# Patient Record
Sex: Female | Born: 1995 | Race: White | Hispanic: No | Marital: Single | Smoking: Never smoker
Health system: Southern US, Community
[De-identification: ages and names within clinical notes are randomized; demographics above are authoritative.]

## PROBLEM LIST (undated history)

## (undated) DIAGNOSIS — N83209 Unspecified ovarian cyst, unspecified side: Secondary | ICD-10-CM

## (undated) DIAGNOSIS — L509 Urticaria, unspecified: Secondary | ICD-10-CM

## (undated) DIAGNOSIS — J45909 Unspecified asthma, uncomplicated: Secondary | ICD-10-CM

## (undated) DIAGNOSIS — L309 Dermatitis, unspecified: Secondary | ICD-10-CM

## (undated) HISTORY — DX: Dermatitis, unspecified: L30.9

## (undated) HISTORY — DX: Urticaria, unspecified: L50.9

## (undated) HISTORY — DX: Unspecified ovarian cyst, unspecified side: N83.209

---
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
## 2015-04-08 ENCOUNTER — Emergency Department (HOSPITAL_BASED_OUTPATIENT_CLINIC_OR_DEPARTMENT_OTHER): Payer: Federal, State, Local not specified - PPO

## 2015-04-08 ENCOUNTER — Encounter (HOSPITAL_BASED_OUTPATIENT_CLINIC_OR_DEPARTMENT_OTHER): Payer: Self-pay | Admitting: *Deleted

## 2015-04-08 ENCOUNTER — Emergency Department (HOSPITAL_BASED_OUTPATIENT_CLINIC_OR_DEPARTMENT_OTHER)
Admission: EM | Admit: 2015-04-08 | Discharge: 2015-04-08 | Disposition: A | Discharge status: Home / Self Care | Payer: Federal, State, Local not specified - PPO | Attending: Emergency Medicine | Admitting: Emergency Medicine

## 2015-04-08 DIAGNOSIS — Y998 Other external cause status: Secondary | ICD-10-CM | POA: Insufficient documentation

## 2015-04-08 DIAGNOSIS — S8002XA Contusion of left knee, initial encounter: Secondary | ICD-10-CM | POA: Insufficient documentation

## 2015-04-08 DIAGNOSIS — S79911A Unspecified injury of right hip, initial encounter: Secondary | ICD-10-CM | POA: Diagnosis not present

## 2015-04-08 DIAGNOSIS — J45909 Unspecified asthma, uncomplicated: Secondary | ICD-10-CM | POA: Diagnosis not present

## 2015-04-08 DIAGNOSIS — S8992XA Unspecified injury of left lower leg, initial encounter: Secondary | ICD-10-CM | POA: Diagnosis present

## 2015-04-08 DIAGNOSIS — Z3202 Encounter for pregnancy test, result negative: Secondary | ICD-10-CM | POA: Diagnosis not present

## 2015-04-08 DIAGNOSIS — Z79899 Other long term (current) drug therapy: Secondary | ICD-10-CM | POA: Insufficient documentation

## 2015-04-08 DIAGNOSIS — Y9389 Activity, other specified: Secondary | ICD-10-CM | POA: Insufficient documentation

## 2015-04-08 DIAGNOSIS — S79912A Unspecified injury of left hip, initial encounter: Secondary | ICD-10-CM | POA: Insufficient documentation

## 2015-04-08 DIAGNOSIS — Y92410 Unspecified street and highway as the place of occurrence of the external cause: Secondary | ICD-10-CM | POA: Diagnosis not present

## 2015-04-08 DIAGNOSIS — M25552 Pain in left hip: Secondary | ICD-10-CM

## 2015-04-08 DIAGNOSIS — M25551 Pain in right hip: Secondary | ICD-10-CM

## 2015-04-08 HISTORY — DX: Unspecified asthma, uncomplicated: J45.909

## 2015-04-08 LAB — PREGNANCY, URINE: PREG TEST UR: NEGATIVE

## 2015-04-08 MED ORDER — IBUPROFEN 800 MG PO TABS: 800.0000 mg | ORAL_TABLET | Freq: Three times a day (TID) | ORAL | Status: DC
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
## 2015-04-08 NOTE — Discharge Instructions (Signed)

## 2016-01-07 HISTORY — PX: TONSILLECTOMY: SUR1361

## 2016-01-23 DIAGNOSIS — F4310 Post-traumatic stress disorder, unspecified: Secondary | ICD-10-CM | POA: Diagnosis not present

## 2016-01-24 DIAGNOSIS — J039 Acute tonsillitis, unspecified: Secondary | ICD-10-CM | POA: Diagnosis not present

## 2016-01-24 DIAGNOSIS — J353 Hypertrophy of tonsils with hypertrophy of adenoids: Secondary | ICD-10-CM | POA: Diagnosis not present

## 2016-01-24 DIAGNOSIS — J3501 Chronic tonsillitis: Secondary | ICD-10-CM | POA: Diagnosis not present
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
## 2016-04-29 DIAGNOSIS — Z0000 Encounter for general adult medical examination without abnormal findings: Secondary | ICD-10-CM | POA: Diagnosis not present

## 2016-04-29 DIAGNOSIS — L700 Acne vulgaris: Secondary | ICD-10-CM | POA: Diagnosis not present

## 2016-04-29 DIAGNOSIS — L508 Other urticaria: Secondary | ICD-10-CM | POA: Diagnosis not present

## 2016-04-29 DIAGNOSIS — R0602 Shortness of breath: Secondary | ICD-10-CM | POA: Diagnosis not present

## 2016-04-29 DIAGNOSIS — L500 Allergic urticaria: Secondary | ICD-10-CM | POA: Diagnosis not present

## 2016-05-09 ENCOUNTER — Ambulatory Visit: Payer: Self-pay | Admitting: Allergy & Immunology

## 2016-05-09 HISTORY — PX: WISDOM TOOTH EXTRACTION: SHX21

## 2016-05-14 ENCOUNTER — Encounter: Payer: Self-pay | Admitting: Allergy and Immunology

## 2016-05-14 ENCOUNTER — Ambulatory Visit (INDEPENDENT_AMBULATORY_CARE_PROVIDER_SITE_OTHER): Payer: Federal, State, Local not specified - PPO | Admitting: Allergy and Immunology

## 2016-05-14 VITALS — BP 96/70 | HR 74 | Temp 98.4°F | Resp 16 | Ht 69.0 in | Wt 152.0 lb

## 2016-05-14 DIAGNOSIS — K2970 Gastritis, unspecified, without bleeding: Secondary | ICD-10-CM

## 2016-05-14 DIAGNOSIS — J4520 Mild intermittent asthma, uncomplicated: Secondary | ICD-10-CM | POA: Diagnosis not present

## 2016-05-14 DIAGNOSIS — J4530 Mild persistent asthma, uncomplicated: Secondary | ICD-10-CM | POA: Insufficient documentation

## 2016-05-14 DIAGNOSIS — L500 Allergic urticaria: Secondary | ICD-10-CM

## 2016-05-14 DIAGNOSIS — J3089 Other allergic rhinitis: Secondary | ICD-10-CM

## 2016-05-14 MED ORDER — LEVOCETIRIZINE DIHYDROCHLORIDE 5 MG PO TABS: 5.0000 mg | ORAL_TABLET | Freq: Every evening | ORAL | 3 refills | Status: DC

## 2016-05-14 MED ORDER — FLUTICASONE PROPIONATE 50 MCG/ACT NA SUSP: 2.0000 | Freq: Every day | NASAL | 5 refills | Status: DC

## 2016-05-14 MED ORDER — MONTELUKAST SODIUM 10 MG PO TABS: 10.0000 mg | ORAL_TABLET | Freq: Every day | ORAL | 3 refills | Status: DC

## 2016-05-14 NOTE — Progress Notes (Signed)
New Patient Note  RE: Brianna Garrison MRN: [id number] DOB: 06-Jan-1996 Date of Office Visit: 05/14/2016  Referring provider: Orbie Hurst, MD Primary care provider: No PCP Per Patient  Chief Complaint: Urticaria and Allergic Rhinitis    History of present illness: Brianna Garrison is a 20 y.o. female presenting today for consultation of hives and rhinitis. Over the past 9 months, [name] has experienced recurrent episodes of hives. Typical distribution includes the entire body.  The lesions are described as erythematous, raised, and pruritic.  Individual hives last less than 24 hours without leaving residual pigmentation or bruising. She denies concomitant angioedema, cardiopulmonary symptoms and GI symptoms. She has not experienced unexpected weight loss, recurrent fevers or drenching night sweats. No specific medication, food or environmental triggers have been identified. The symptoms do not seem to correlate with NSAIDs use or emotional stress. She did not have signs or symptoms of infection at the time of symptom onset. [name] has tried to control symptoms with OTC antihistamines which have offered moderate temporary relief of symptoms. She has not been evaluated and treated in the emergency department for these symptoms. Skin biopsy has not been performed.  [name] experiences nasal congestion, rhinorrhea, and sneezing.  These symptoms are most pronounced during the spring, summer, and fall.  She has a history of intermittent asthma.  She typically requires albuterol rescue once per month on average, not including albuterol use prior to exercise.  Specific asthma triggers include exercise and upper respiratory tract infections, however she occasionally experiences "random" symptoms at rest.  She does not experience nocturnal awakenings due to lower respiratory symptoms.   Assessment and plan: Recurrent urticaria Unclear etiology. Skin tests to select food allergens were negative today. NSAIDs and  emotional stress commonly exacerbate urticaria but are not the underlying etiology in this case. Physical urticarias are negative by history (i.e. pressure-induced, temperature, vibration, solar, etc.). History and lesions are not consistent with urticaria pigmentosa so I am not suspicious for mastocytosis. There are no concomitant symptoms concerning for anaphylaxis or constitutional symptoms worrisome for an underlying malignancy. We will rule out other potential etiologies with labs. For symptom relief, patient is to take oral antihistamines as directed.  The following labs have been ordered: FCeRI antibody, TSH, anti-thyroglobulin antibody, thyroid peroxidase antibody, tryptase, urea breath test, ESR, ANA, and galactose-alpha-1,3-galactose IgE level. CBC and CMP were recently checked and within normal limits.   The patient will be called with further recommendations after lab results have returned.  Instructions have been discussed and provided for H1/H2 receptor blockade with titration to find lowest effective dose.  A prescription has been provided for levocetirizine, 77m daily as needed.  A prescription has been provided for montelukast 10 mg daily at bedtime.  A journal is to be kept recording any foods eaten, beverages consumed, medications taken within a 6 hour period prior to the onset of symptoms, as well as record activities being performed, and environmental conditions. For any symptoms concerning for anaphylaxis, 911 is to be called immediately.  Perennial and seasonal allergic rhinitis  Aeroallergen avoidance measures have been discussed and provided in written form.  A prescription has been provided for levocetirizine (as above).  Montelukast has been prescribed (as above).  A prescription has been provided for fluticasone nasal spray, 2 sprays per nostril daily as needed. Proper nasal spray technique has been discussed and demonstrated.  Mild intermittent asthma  A  prescription has been provided for ProAir Respiclick, 1-2 inhalations every 4-6 hours as needed.  Subjective  and objective measures of pulmonary function will be followed and the treatment plan will be adjusted accordingly.   Meds ordered this encounter  Medications  . levocetirizine (XYZAL) 5 MG tablet    Sig: Take 1 tablet (5 mg total) by mouth every evening.    Dispense:  90 tablet    Refill:  3    90 day supply  . montelukast (SINGULAIR) 10 MG tablet    Sig: Take 1 tablet (10 mg total) by mouth at bedtime.    Dispense:  90 tablet    Refill:  3    90 day supply  . fluticasone (FLONASE) 50 MCG/ACT nasal spray    Sig: Place 2 sprays into both nostrils daily.    Dispense:  1 g    Refill:  5    Diagnositics: Spirometry:  Normal with an FEV1 of 96% predicted.  Please see scanned spirometry results for details. Environmental skin testing: Positive to grass pollens, weed pollens, ragweed pollen, and tree pollens. Food allergen skin testing:  Negative despite a positive histamine control.    Physical examination: Blood pressure 96/70, pulse 74, temperature 98.4 F (36.9 C), temperature source Oral, resp. rate 16, height '5\' 9"'  (1.753 m), weight 152 lb (68.9 kg).  General: Alert, interactive, in no acute distress. HEENT: TMs pearly gray, turbinates edematous and pale without discharge, post-pharynx mildly erythematous. Neck: Supple without lymphadenopathy. Lungs: Clear to auscultation without wheezing, rhonchi or rales. CV: Normal S1, S2 without murmurs. Abdomen: Nondistended, nontender. Skin: Warm and dry, without lesions or rashes. Extremities:  No clubbing, cyanosis or edema. Neuro:   Grossly intact.  Review of systems:  Review of systems negative except as noted in HPI / PMHx or noted below: Review of Systems  Constitutional: Negative.   HENT: Negative.   Eyes: Negative.   Respiratory: Negative.   Cardiovascular: Negative.   Gastrointestinal: Negative.     Genitourinary: Negative.   Musculoskeletal: Negative.   Skin: Negative.   Neurological: Negative.   Endo/Heme/Allergies: Negative.   Psychiatric/Behavioral: Negative.     Past medical history:  Past Medical History:  Diagnosis Date  . Asthma   . Eczema     Past surgical history:  Past Surgical History:  Procedure Laterality Date  . TONSILLECTOMY  01/2016  . WISDOM TOOTH EXTRACTION  05/2016   top two    Family history: Family History  Problem Relation Age of Onset  . Eczema Father   . Asthma Sister   . Eczema Sister   . Allergic rhinitis Neg Hx   . Urticaria Neg Hx   . Immunodeficiency Neg Hx   . Atopy Neg Hx   . Angioedema Neg Hx     Social history: Social History   Social History  . Marital status: Single    Spouse name: N/A  . Number of children: N/A  . Years of education: N/A   Occupational History  . Not on file.   Social History Main Topics  . Smoking status: Never Smoker  . Smokeless tobacco: Never Used  . Alcohol use Yes  . Drug use: Unknown  . Sexual activity: Not on file   Other Topics Concern  . Not on file   Social History Narrative  . No narrative on file   Environmental History: The patient lives in 20 year old house with carpeting in the bedroom and central air/heat.  There is a dog in house which has access to her bedroom.  She is a nonsmoker.    Medication List  Accurate as of 05/14/16  6:27 PM. Always use your most recent med list.          albuterol 108 (90 Base) MCG/ACT inhaler Commonly known as:  PROVENTIL HFA;VENTOLIN HFA Inhale 2 puffs into the lungs as needed for wheezing or shortness of breath.   cetirizine 10 MG tablet Commonly known as:  ZYRTEC Take 10 mg by mouth daily.   fluticasone 50 MCG/ACT nasal spray Commonly known as:  FLONASE Place 2 sprays into both nostrils daily.   ibuprofen 800 MG tablet Commonly known as:  ADVIL,MOTRIN Take 1 tablet (800 mg total) by mouth 3 (three) times daily.    levocetirizine 5 MG tablet Commonly known as:  XYZAL Take 1 tablet (5 mg total) by mouth every evening.   loratadine 10 MG tablet Commonly known as:  CLARITIN Take 10 mg by mouth daily.   montelukast 10 MG tablet Commonly known as:  SINGULAIR Take 1 tablet (10 mg total) by mouth at bedtime.       Known medication allergies: No Known Allergies  I appreciate the opportunity to take part in [facility] care. Please do not hesitate to contact me with questions.  Sincerely,   R. Edgar Frisk, MD

## 2016-05-14 NOTE — Patient Instructions (Addendum)
Recurrent urticaria Unclear etiology. Skin tests to select food allergens were negative today. NSAIDs and emotional stress commonly exacerbate urticaria but are not the underlying etiology in this case. Physical urticarias are negative by history (i.e. pressure-induced, temperature, vibration, solar, etc.). History and lesions are not consistent with urticaria pigmentosa so I am not suspicious for mastocytosis. There are no concomitant symptoms concerning for anaphylaxis or constitutional symptoms worrisome for an underlying malignancy. We will rule out other potential etiologies with labs. For symptom relief, patient is to take oral antihistamines as directed.  The following labs have been ordered: FCeRI antibody, TSH, anti-thyroglobulin antibody, thyroid peroxidase antibody, tryptase, urea breath test, ESR, ANA, and galactose-alpha-1,3-galactose IgE level. CBC and CMP were recently checked and within normal limits.   The patient will be called with further recommendations after lab results have returned.  Instructions have been discussed and provided for H1/H2 receptor blockade with titration to find lowest effective dose.  A prescription has been provided for levocetirizine, 11m daily as needed.  A prescription has been provided for montelukast 10 mg daily at bedtime.  A journal is to be kept recording any foods eaten, beverages consumed, medications taken within a 6 hour period prior to the onset of symptoms, as well as record activities being performed, and environmental conditions. For any symptoms concerning for anaphylaxis, 911 is to be called immediately.  Perennial and seasonal allergic rhinitis  Aeroallergen avoidance measures have been discussed and provided in written form.  A prescription has been provided for levocetirizine (as above).  Montelukast has been prescribed (as above).  A prescription has been provided for fluticasone nasal spray, 2 sprays per nostril daily as needed.  Proper nasal spray technique has been discussed and demonstrated.  Mild intermittent asthma  A prescription has been provided for ProAir Respiclick, 1-2 inhalations every 4-6 hours as needed.  Subjective and objective measures of pulmonary function will be followed and the treatment plan will be adjusted accordingly.   When lab results have returned the patient will be called with further recommendations and follow up instructions.     Urticaria (Hives)  Montelukast 10 mg daily.  . Levocetirizine (Xyzal) 5 mg twice a day and ranitidine (Zantac) 150 mg twice a day. If no symptoms for 7-14 days then decrease to. . Levocetirizine (Xyzal) 5 mg twice a day and ranitidine (Zantac) 150 mg once a day.  If no symptoms for 7-14 days then decrease to. . Levocetirizine (Xyzal) 5 mg twice a day.  If no symptoms for 7-14 days then decrease to. . Levocetirizine (Xyzal) 5 mg once a day.  May use Benadryl (diphenhydramine) as needed for breakthrough symptoms       If symptoms return, then step up dosage  Reducing Pollen Exposure  The American Academy of Allergy, Asthma and Immunology suggests the following steps to reduce your exposure to pollen during allergy seasons.    1. Do not hang sheets or clothing out to dry; pollen may collect on these items. 2. Do not mow lawns or spend time around freshly cut grass; mowing stirs up pollen. 3. Keep windows closed at night.  Keep car windows closed while driving. 4. Minimize morning activities outdoors, a time when pollen counts are usually at their highest. 5. Stay indoors as much as possible when pollen counts or humidity is high and on windy days when pollen tends to remain in the air longer. 6. Use air conditioning when possible.  Many air conditioners have filters that trap the pollen spores. 7.  Use a HEPA room air filter to remove pollen form the indoor air you breathe.

## 2016-05-14 NOTE — Assessment & Plan Note (Addendum)
Unclear etiology. Skin tests to select food allergens were negative today. NSAIDs and emotional stress commonly exacerbate urticaria but are not the underlying etiology in this case. Physical urticarias are negative by history (i.e. pressure-induced, temperature, vibration, solar, etc.). History and lesions are not consistent with urticaria pigmentosa so I am not suspicious for mastocytosis. There are no concomitant symptoms concerning for anaphylaxis or constitutional symptoms worrisome for an underlying malignancy. We will rule out other potential etiologies with labs. For symptom relief, patient is to take oral antihistamines as directed.  The following labs have been ordered: FCeRI antibody, TSH, anti-thyroglobulin antibody, thyroid peroxidase antibody, tryptase, urea breath test, ESR, ANA, and galactose-alpha-1,3-galactose IgE level. CBC and CMP were recently checked and within normal limits.   The patient will be called with further recommendations after lab results have returned.  Instructions have been discussed and provided for H1/H2 receptor blockade with titration to find lowest effective dose.  A prescription has been provided for levocetirizine, 7m daily as needed.  A prescription has been provided for montelukast 10 mg daily at bedtime.  A journal is to be kept recording any foods eaten, beverages consumed, medications taken within a 6 hour period prior to the onset of symptoms, as well as record activities being performed, and environmental conditions. For any symptoms concerning for anaphylaxis, 911 is to be called immediately.

## 2016-05-14 NOTE — Assessment & Plan Note (Signed)
   A prescription has been provided for ProAir Respiclick, 1-2 inhalations every 4-6 hours as needed.  Subjective and objective measures of pulmonary function will be followed and the treatment plan will be adjusted accordingly. 

## 2016-05-14 NOTE — Assessment & Plan Note (Signed)
   Aeroallergen avoidance measures have been discussed and provided in written form.  A prescription has been provided for levocetirizine (as above).  Montelukast has been prescribed (as above).  A prescription has been provided for fluticasone nasal spray, 2 sprays per nostril daily as needed. Proper nasal spray technique has been discussed and demonstrated.

## 2016-05-15 LAB — ANA, IFA COMPREHENSIVE PANEL
Anti Nuclear Antibody(ANA): POSITIVE — AB
ENA SM Ab Ser-aCnc: <1
SM/RNP: <1
SSA (Ro) (ENA) Antibody, IgG: <1
SSB (La) (ENA) Antibody, IgG: <1
Scleroderma (Scl-70) (ENA) Antibody, IgG: <1
ds DNA Ab: <1 IU/mL

## 2016-05-15 LAB — SEDIMENTATION RATE: Sed Rate: 1 mm/hr (ref 0–20)

## 2016-05-15 LAB — ANTI-NUCLEAR AB-TITER (ANA TITER): ANA Titer 1: 1:160 {titer} — ABNORMAL HIGH

## 2016-05-15 LAB — H. PYLORI BREATH TEST: H. pylori Breath Test: NOT DETECTED

## 2016-05-17 LAB — ALPHA-GAL PANEL
Allergen, Mutton, f88: <0.1 kU/L
Allergen, Pork, f26: <0.1 kU/L
Beef: <0.1 kU/L
Galactose-alpha-1,3-galactose IgE: <0.1 kU/L (ref <0.35)

## 2016-05-19 DIAGNOSIS — R399 Unspecified symptoms and signs involving the genitourinary system: Secondary | ICD-10-CM | POA: Diagnosis not present

## 2016-05-19 DIAGNOSIS — R350 Frequency of micturition: Secondary | ICD-10-CM | POA: Diagnosis not present

## 2016-05-21 LAB — CP CHRONIC URTICARIA INDEX PANEL
Histamine Release: <16 % (ref <16)
TSH: 1.72 mIU/L
Thyroglobulin Ab: <1 IU/mL (ref <2)
Thyroperoxidase Ab SerPl-aCnc: 2 IU/mL (ref <9)

## 2016-05-23 LAB — TRYPTASE: Tryptase: 4 ug/L (ref <11)

## 2016-06-16 ENCOUNTER — Telehealth: Payer: Self-pay | Admitting: *Deleted

## 2016-06-16 NOTE — Telephone Encounter (Signed)
Tried calling patient on 9/18, 9/29 and 10/2 and patient has not returned our call. Mailed patient a letter asking to call office.

## 2016-06-16 NOTE — Telephone Encounter (Signed)
-----   Message from Cristal Fordalph Carter Bobbitt, MD sent at 05/26/2016 12:23 PM EDT ----- All labs were negative with the exception of ANA. The presence of ANA represents a false positive result in 4-8% of individuals tested.  Continue H1/H2 receptor blockade, titrating to the lowest effective dose necessary to suppress urticaria. Continue symptom/exposure journal. If urticaria persists or progresses, further evaluation by a rheumatologist may be helpful. Thanks.

## 2016-06-27 ENCOUNTER — Telehealth: Payer: Self-pay | Admitting: Allergy and Immunology

## 2016-06-27 NOTE — Telephone Encounter (Signed)
Patient's mom, Harriett Sineancy, called and said her daughter, Brianna Garrison, received a letter in the mail saying our office was trying to reach her about test results. She said her daughter is studying abroad in [place]. Mom is requesting the results and said her daughter has her authorized to do so.

## 2016-06-30 NOTE — Telephone Encounter (Signed)
Left message for patient's mom to call office

## 2016-07-01 NOTE — Telephone Encounter (Signed)
Informed patient's mom of results and she states as far as she knows she is not having any joint pain/muscle pain or fevers but she is still having problems with hives. Informed mom to just have patient follow up with our office once she returns from studying aboard.

## 2016-09-17 ENCOUNTER — Encounter: Payer: Self-pay | Admitting: Women's Health

## 2016-09-17 ENCOUNTER — Ambulatory Visit (INDEPENDENT_AMBULATORY_CARE_PROVIDER_SITE_OTHER): Payer: Federal, State, Local not specified - PPO | Admitting: Women's Health

## 2016-09-17 VITALS — BP 110/78 | Ht 69.0 in | Wt 158.0 lb

## 2016-09-17 DIAGNOSIS — Z113 Encounter for screening for infections with a predominantly sexual mode of transmission: Secondary | ICD-10-CM

## 2016-09-17 DIAGNOSIS — Z01419 Encounter for gynecological examination (general) (routine) without abnormal findings: Secondary | ICD-10-CM

## 2016-09-17 DIAGNOSIS — Z30011 Encounter for initial prescription of contraceptive pills: Secondary | ICD-10-CM | POA: Diagnosis not present

## 2016-09-17 LAB — HIV ANTIBODY (ROUTINE TESTING W REFLEX): HIV: NONREACTIVE

## 2016-09-17 LAB — HEPATITIS B SURFACE ANTIGEN: Hepatitis B Surface Ag: NEGATIVE

## 2016-09-17 LAB — CBC WITH DIFFERENTIAL/PLATELET
BASOS ABS: 0 {cells}/uL (ref 0–200)
Basophils Relative: 0 %
EOS ABS: 164 {cells}/uL (ref 15–500)
Eosinophils Relative: 2 %
HEMATOCRIT: 41.4 % (ref 35.0–45.0)
HEMOGLOBIN: 14 g/dL (ref 11.7–15.5)
LYMPHS ABS: 2050 {cells}/uL (ref 850–3900)
Lymphocytes Relative: 25 %
MCH: 30.8 pg (ref 27.0–33.0)
MCHC: 33.8 g/dL (ref 32.0–36.0)
MCV: 91 fL (ref 80.0–100.0)
MONO ABS: 738 {cells}/uL (ref 200–950)
MPV: 9.9 fL (ref 7.5–12.5)
Monocytes Relative: 9 %
NEUTROS PCT: 64 %
Neutro Abs: 5248 cells/uL (ref 1500–7800)
Platelets: 244 10*3/uL (ref 140–400)
RBC: 4.55 MIL/uL (ref 3.80–5.10)
RDW: 12.9 % (ref 11.0–15.0)
WBC: 8.2 10*3/uL (ref 3.8–10.8)

## 2016-09-17 LAB — HEPATITIS C ANTIBODY: HCV Ab: NEGATIVE

## 2016-09-17 MED ORDER — NORETHIN ACE-ETH ESTRAD-FE 1-20 MG-MCG PO TABS: 1.0000 | ORAL_TABLET | Freq: Every day | ORAL | 4 refills | Status: DC

## 2016-09-17 NOTE — Progress Notes (Signed)
Rolly SalterHaley Saladin 11/11/1995 [id number]    History:    Presents for annual exam.  Monthly cycle. New partner.  Gardasil series completed.  Past medical history, past surgical history, family history and social history were all reviewed and documented in the EPIC chart. 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 [place] completed a semester abroad in [place] from [place], had unprotected intercourse January 3. Parents healthy.  ROS:  A ROS was performed and pertinent positives and negatives are included.  Exam:  Vitals:   09/17/16 1410  BP: 110/78  Weight: 158 lb (71.7 kg)  Height: 5\' 9"  (1.753 m)   Body mass index is 23.33 kg/m.   General appearance:  Normal Thyroid:  Symmetrical, normal in size, without palpable masses or nodularity. Respiratory  Auscultation:  Clear without wheezing or rhonchi Cardiovascular  Auscultation:  Regular rate, without rubs, murmurs or gallops  Edema/varicosities:  Not grossly evident Abdominal  Soft,nontender, without masses, guarding or rebound.  Liver/spleen:  No organomegaly noted  Hernia:  None appreciated  Skin  Inspection:  Grossly normal   Breasts: Examined lying and sitting.     Right: Without masses, retractions, discharge or axillary adenopathy.     Left: Without masses, retractions, discharge or axillary adenopathy. Gentitourinary   Inguinal/mons:  Normal without inguinal adenopathy  External genitalia:  Normal  BUS/Urethra/Skene's glands:  Normal  Vagina:  Normal  Cervix:  Normal  Uterus:   normal in size, shape and contour.  Midline and mobile  Adnexa/parametria:     Rt: Without masses or tenderness.   Lt: Without masses or tenderness.  Anus and perineum: Normal    Assessment/Plan:  21 y.o. S W[place] G0 for annual exam with no complaints.  Monthly cycle Contraception management STD screen Asthma-primary care  Plan: Contraception options reviewed will try Loestrin 1/20 prescription, proper use, slight risk for  blood clots and strokes, start up instructions reviewed encouraged condoms especially first month and for infection control until permanent partner. Instructed to call if no cycle, greater than one week from unprotected intercourse. SBE's, exercise, calcium rich diet, MVI daily encouraged. CBC, UA, GC/Chlamydia, HIV, hep B, C, RPR,    Harrington ChallengerYOUNG,Aaliyah Gavel J WHNP, 3:05 PM 09/17/2016

## 2016-09-17 NOTE — Patient Instructions (Signed)

## 2016-09-18 LAB — GC/CHLAMYDIA PROBE AMP
CT Probe RNA: NOT DETECTED
GC PROBE AMP APTIMA: NOT DETECTED

## 2016-09-18 LAB — RPR

## 2016-10-07 DIAGNOSIS — J019 Acute sinusitis, unspecified: Secondary | ICD-10-CM | POA: Diagnosis not present

## 2016-10-29 ENCOUNTER — Telehealth: Payer: Self-pay | Admitting: *Deleted

## 2016-10-29 NOTE — Telephone Encounter (Signed)
Pt called and left message in triage voicemail wanting to discuss her cycles, I called pt back and received her voicemail,i asked her to call me back

## 2016-11-18 ENCOUNTER — Telehealth: Payer: Self-pay

## 2016-11-18 NOTE — Telephone Encounter (Signed)
Patient called regarding problems with BCP's.  Patient said she was instructed to start on them Sunday after her office visit. She did not start with menses. LMP at visit had been 08/25/17 with unprotected intercourse on 09/10/16.  Complains that with last pack a week before placebos she had extreme bleeding. "The way it was coming out had never happened before."  So heavy that she had to throw away her rug at toilet. Happened twice where it gushed out and ruined rug.  Now is mid second pack and is having daily cramping and brown spotting since Saturday.

## 2016-11-18 NOTE — Telephone Encounter (Signed)
Telephone call, will check a at home pregnancy test. States did not start pills with cycle. Does have a negative STD screen. Reviewed common to have irregular spotting and bleeding first couple months on OCs. Denies missed pills.

## 2016-11-18 NOTE — Telephone Encounter (Signed)
Message left

## 2016-11-20 NOTE — Telephone Encounter (Signed)
Telephone call, states continues spot will double up today and tomorrow if needed, will only take 4 days off placebo week.  negative UPT 2.

## 2016-11-20 NOTE — Telephone Encounter (Signed)
Message left for F/U

## 2017-01-21 ENCOUNTER — Encounter: Payer: Self-pay | Admitting: Gynecology

## 2017-04-10 DIAGNOSIS — J029 Acute pharyngitis, unspecified: Secondary | ICD-10-CM | POA: Diagnosis not present

## 2017-05-28 ENCOUNTER — Other Ambulatory Visit: Payer: Self-pay

## 2017-05-28 ENCOUNTER — Other Ambulatory Visit: Payer: Self-pay | Admitting: Allergy and Immunology

## 2017-05-28 DIAGNOSIS — Z30011 Encounter for initial prescription of contraceptive pills: Secondary | ICD-10-CM

## 2017-05-28 DIAGNOSIS — J3089 Other allergic rhinitis: Secondary | ICD-10-CM

## 2017-05-28 DIAGNOSIS — L500 Allergic urticaria: Secondary | ICD-10-CM

## 2017-05-28 MED ORDER — LEVOCETIRIZINE DIHYDROCHLORIDE 5 MG PO TABS: 5.0000 mg | ORAL_TABLET | Freq: Every evening | ORAL | 0 refills | Status: DC

## 2017-05-28 MED ORDER — MONTELUKAST SODIUM 10 MG PO TABS: 10.0000 mg | ORAL_TABLET | Freq: Every day | ORAL | 0 refills | Status: DC

## 2017-05-28 NOTE — Telephone Encounter (Signed)
Patient has called in to make an appt in order to get her medications refills - - patient has an appt scheduled for the end of October Can a courtesy refill be called in for MONTELUKAST and XYZAL to allow her to have medications until she is seen. Patient has had to schedule her appt out so far due to school schedule Please call patient with any questions

## 2017-05-28 NOTE — Telephone Encounter (Signed)
Called patient and left message advising that I have sent 1 courtesy fill for these 2 prescriptions. She must keep OV for any further.

## 2017-05-28 NOTE — Telephone Encounter (Signed)
Patient

## 2017-06-29 ENCOUNTER — Other Ambulatory Visit: Payer: Self-pay | Admitting: Allergy and Immunology

## 2017-06-29 DIAGNOSIS — L500 Allergic urticaria: Secondary | ICD-10-CM

## 2017-06-29 DIAGNOSIS — J3089 Other allergic rhinitis: Secondary | ICD-10-CM

## 2017-07-03 ENCOUNTER — Encounter: Payer: Self-pay | Admitting: Allergy

## 2017-07-03 ENCOUNTER — Ambulatory Visit: Payer: Federal, State, Local not specified - PPO | Admitting: Allergy

## 2017-07-03 ENCOUNTER — Ambulatory Visit (INDEPENDENT_AMBULATORY_CARE_PROVIDER_SITE_OTHER): Payer: Federal, State, Local not specified - PPO | Admitting: Allergy

## 2017-07-03 VITALS — BP 108/70 | HR 97 | Resp 18

## 2017-07-03 DIAGNOSIS — J4520 Mild intermittent asthma, uncomplicated: Secondary | ICD-10-CM | POA: Diagnosis not present

## 2017-07-03 DIAGNOSIS — J3089 Other allergic rhinitis: Secondary | ICD-10-CM

## 2017-07-03 DIAGNOSIS — L500 Allergic urticaria: Secondary | ICD-10-CM | POA: Diagnosis not present

## 2017-07-03 MED ORDER — MONTELUKAST SODIUM 10 MG PO TABS: 10.0000 mg | ORAL_TABLET | Freq: Every evening | ORAL | 10 refills | Status: DC

## 2017-07-03 MED ORDER — LEVOCETIRIZINE DIHYDROCHLORIDE 5 MG PO TABS
5.0000 mg | ORAL_TABLET | Freq: Every evening | ORAL | 10 refills | Status: DC
Start: 2017-07-03 — End: 2017-11-30

## 2017-07-03 MED ORDER — LEVOCETIRIZINE DIHYDROCHLORIDE 5 MG PO TABS: 5.0000 mg | ORAL_TABLET | Freq: Every evening | ORAL | 10 refills | Status: DC

## 2017-07-03 NOTE — Patient Instructions (Addendum)
Chronic idiopathic urticaria You have had negative skin testing to select food in the past. Your labwork was reassuring except for a positive ANA-- you do not have any concerning features with your hives.  Let us know if you ever have fevers, joint aches/pains or bruising. NSAIDs and emotional stress commonly exacerbate urticaria but are not the underlying etiology in this case. Physical urticarias are negative by history (i.e. pressure-induced, temperature, vibration, solar, etc.). For symptom relief, continue taking the following:     levocetirizine, 5mg  daily    montelukast 10 mg daily   Perennial and seasonal allergic rhinitis  Continue allergen avoidance measures   cotinue xyzal and montelukast as above   Mild intermittent asthma  have access to albuterol inhaler 2 puffs every 4-6 hours as needed for cough/wheeze/shortness of breath/chest tightness.  May use 15-20 minutes prior to activity.   Monitor frequency of use.   Asthma control goals:  Full participation in all desired activities (may need albuterol before activity) Albuterol use two time or less a week on average (not counting use with activity) Cough interfering with sleep two time or less a month Oral steroids no more than once a year No hospitalizations   Follow-up 1 year or sooner if needed

## 2017-07-03 NOTE — Progress Notes (Signed)
Follow-up Note  RE: Brianna Garrison Kachmar MRN: [id number] DOB: 03/04/1996 Date of Office Visit: 07/03/2017   History of present illness: Brianna Garrison is a 21 y.o. female presenting today for follow-up of hives, allergic rhinitis and asthma.  She was last seen in the office on May 14, 2016 by Dr. Nunzio CobbsBobbitt.  Since this visit she has done well without any major health changes, surgeries or hospitalizations.   She is on xyzal and singulair daily for control of her hives.  She is happy with her control.  She states she can still see remnants of hives multiple times a week however she does not have palpable hives and it is not itchy.  She denies any fevers, joint aches/pains or bruising.  She did have labs done after last visit that were reassuring besides a positive ANA.  She has no systemic symptoms suggestive for autoimmune or rheumatologic conditions.   She reports no issues with nasal or ocular symptoms with use of singulair and xyzal.  With her asthma she reports it is well-controlled.  She does not recall the last time she used her albuterol.  She denies any nighttime awakenings, no ED/UC visits or need for oral steroids.    Review of systems: Review of Systems  Constitutional: Negative for chills, fever and malaise/fatigue.  HENT: Negative for congestion, ear discharge, ear pain, nosebleeds, sinus pain, sore throat and tinnitus.   Eyes: Negative for discharge and redness.  Respiratory: Negative for cough, shortness of breath and wheezing.   Cardiovascular: Negative for chest pain.  Gastrointestinal: Negative for abdominal pain, constipation, diarrhea, heartburn, nausea and vomiting.  Musculoskeletal: Negative for joint pain.  Skin: Positive for rash. Negative for itching.  Neurological: Negative for headaches.    All other systems negative unless noted above in HPI  Past medical/social/surgical/family history have been reviewed and are unchanged unless specifically indicated below.  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 Always use your most recent med list.          albuterol 108 (90 Base) MCG/ACT inhaler Commonly known as:  PROVENTIL HFA;VENTOLIN HFA Inhale 2 puffs into the lungs as needed for wheezing or shortness of breath.   ibuprofen 800 MG tablet Commonly known as:  ADVIL,MOTRIN Take 1 tablet (800 mg total) by mouth 3 (three) times daily.   levocetirizine 5 MG tablet Commonly known as:  XYZAL Take 1 tablet (5 mg total) by mouth every evening.   montelukast 10 MG tablet Commonly known as:  SINGULAIR Take 1 tablet (10 mg total) by mouth every evening.   norethindrone-ethinyl estradiol 1-20 MG-MCG tablet Commonly known as:  JUNEL FE,GILDESS FE,LOESTRIN FE Take 1 tablet by mouth daily.       Known medication allergies: No Known Allergies   Physical examination: Blood pressure 108/70, pulse 97, resp. rate 18, SpO2 96 %.  General: Alert, interactive, in no acute distress. HEENT: PERRLA, TMs pearly gray, turbinates non-edematous without discharge, post-pharynx non erythematous. Neck: Supple without lymphadenopathy. Lungs: Clear to auscultation without wheezing, rhonchi or rales. {no increased work of breathing. CV: Normal S1, S2 without murmurs. Abdomen: Nondistended, nontender. Skin: Warm and dry, without lesions or rashes. Extremities:  No clubbing, cyanosis or edema. Neuro:   Grossly intact.  Diagnositics/Labs: Spirometry  FVC 4.13L  102%, 3.32L  94% non-obstructive  pattern  Assessment and plan:   Chronic idiopathic urticaria You have had negative skin testing to select food in the past. Your labwork was reassuring except for a positive ANA-- you do not have any concerning features with your hives.  Let us know if you ever have fevers, joint aches/pains or bruising. NSAIDs and emotional stress  commonly exacerbate urticaria but are not the underlying etiology in this case. Physical urticarias are negative by history (i.e. pressure-induced, temperature, vibration, solar, etc.). For symptom relief, continue taking the following:     levocetirizine, 5mg  daily    montelukast 10 mg daily   Perennial and seasonal allergic rhinitis  Continue allergen avoidance measures   cotinue xyzal and montelukast as above  Mild intermittent asthma  have access to albuterol inhaler 2 puffs every 4-6 hours as needed for cough/wheeze/shortness of breath/chest tightness.  May use 15-20 minutes prior to activity.   Monitor frequency of use.   Asthma control goals:  Full participation in all desired activities (may need albuterol before activity) Albuterol use two time or less a week on average (not counting use with activity) Cough interfering with sleep two time or less a month Oral steroids no more than once a year No hospitalizations   Follow-up 1 year or sooner if needed   I appreciate the opportunity to take part in [facility] care. Please do not hesitate to contact me with questions.  Sincerely,   Margo Aye, MD Allergy/Immunology [facility]

## 2017-09-21 ENCOUNTER — Encounter: Payer: Federal, State, Local not specified - PPO | Admitting: Women's Health

## 2017-10-02 ENCOUNTER — Telehealth: Payer: Self-pay | Admitting: Women's Health

## 2017-10-02 DIAGNOSIS — Z30011 Encounter for initial prescription of contraceptive pills: Secondary | ICD-10-CM

## 2017-10-02 MED ORDER — NORETHIN ACE-ETH ESTRAD-FE 1-20 MG-MCG PO TABS: 1.0000 | ORAL_TABLET | Freq: Every day | ORAL | 0 refills | Status: DC

## 2017-10-02 NOTE — Telephone Encounter (Signed)
Pt is out of town need birth pills. Left pack at home. Gave her enough to last until Appt

## 2017-10-10 ENCOUNTER — Other Ambulatory Visit: Payer: Self-pay | Admitting: Women's Health

## 2017-10-10 DIAGNOSIS — Z30011 Encounter for initial prescription of contraceptive pills: Secondary | ICD-10-CM

## 2017-10-12 NOTE — Telephone Encounter (Signed)
annual on 11/04/17

## 2017-11-04 ENCOUNTER — Ambulatory Visit (INDEPENDENT_AMBULATORY_CARE_PROVIDER_SITE_OTHER): Payer: Federal, State, Local not specified - PPO | Admitting: Women's Health

## 2017-11-04 ENCOUNTER — Encounter: Payer: Self-pay | Admitting: Women's Health

## 2017-11-04 VITALS — BP 110/78 | Ht 69.0 in | Wt 153.0 lb

## 2017-11-04 DIAGNOSIS — Z30011 Encounter for initial prescription of contraceptive pills: Secondary | ICD-10-CM | POA: Diagnosis not present

## 2017-11-04 DIAGNOSIS — Z113 Encounter for screening for infections with a predominantly sexual mode of transmission: Secondary | ICD-10-CM

## 2017-11-04 DIAGNOSIS — Z01419 Encounter for gynecological examination (general) (routine) without abnormal findings: Secondary | ICD-10-CM | POA: Diagnosis not present

## 2017-11-04 DIAGNOSIS — R87610 Atypical squamous cells of undetermined significance on cytologic smear of cervix (ASC-US): Secondary | ICD-10-CM | POA: Diagnosis not present

## 2017-11-04 MED ORDER — NORETHIN ACE-ETH ESTRAD-FE 1-20 MG-MCG PO TABS: 1.0000 | ORAL_TABLET | Freq: Every day | ORAL | 4 refills | Status: DC

## 2017-11-04 NOTE — Patient Instructions (Signed)

## 2017-11-04 NOTE — Progress Notes (Signed)
Brianna SalterHaley Garrison 12/30/1995 [id number]    History:    Presents for annual exam.  Regular monthly cycle on Loestrin 1/20. Gardasil series completed. New partner.   Past medical history, past surgical history, family history and social history were all reviewed and documented in the EPIC chart. 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 Had a [place] in [place] last year. Mother hypothyroid. Father healthy..  ROS:  A ROS was performed and pertinent positives and negatives are included.  Exam:  Vitals:   11/04/17 1525  BP: 110/78  Weight: 153 lb (69.4 kg)  Height: 5\' 9"  (1.753 m)   Body mass index is 22.59 kg/m.   General appearance:  Normal Thyroid:  Symmetrical, normal in size, without palpable masses or nodularity. Respiratory  Auscultation:  Clear without wheezing or rhonchi Cardiovascular  Auscultation:  Regular rate, without rubs, murmurs or gallops  Edema/varicosities:  Not grossly evident Abdominal  Soft,nontender, without masses, guarding or rebound.  Liver/spleen:  No organomegaly noted  Hernia:  None appreciated  Skin  Inspection:  Grossly normal   Breasts: Examined lying and sitting.     Right: Without masses, retractions, discharge or axillary adenopathy.     Left: Without masses, retractions, discharge or axillary adenopathy. Gentitourinary   Inguinal/mons:  Normal without inguinal adenopathy  External genitalia:  Normal  BUS/Urethra/Skene's glands:  Normal  Vagina:  Normal  Cervix:  Normal  Uterus:  normal in size, shape and contour.  Midline and mobile  Adnexa/parametria:     Rt: Without masses or tenderness.   Lt: Without masses or tenderness.  Anus and perineum: Normal   Assessment/Plan:  22 y.o. S WF G0  for annual exam with no complaints.  Regular monthly cycle on Loestrin STD screen  Plan: Loestrin 1/20 prescription, proper use, slight risk for blood clots and strokes reviewed. Reviewed importance of condoms until permanent partner.  SBE's, exercise, calcium rich diet, MVI daily encouraged. Campus safety reviewed. CBC, GC/Chlamydia, HIV, hep B, C, RPR, Pap, new screening guidelines reviewed.  Harrington Challengerancy J Ezra Denne [facility] RockWHNP, 4:03 PM 11/04/2017

## 2017-11-04 NOTE — Addendum Note (Signed)
Addended by: Tito DineBONHAM, KIM A on: 11/04/2017 04:10 PM   Modules accepted: Orders

## 2017-11-05 LAB — CBC WITH DIFFERENTIAL/PLATELET
BASOS ABS: 32 {cells}/uL (ref 0–200)
Basophils Relative: 0.5 %
EOS PCT: 2.2 %
Eosinophils Absolute: 139 cells/uL (ref 15–500)
HEMATOCRIT: 39 % (ref 35.0–45.0)
Hemoglobin: 13.2 g/dL (ref 11.7–15.5)
LYMPHS ABS: 2079 {cells}/uL (ref 850–3900)
MCH: 30.7 pg (ref 27.0–33.0)
MCHC: 33.8 g/dL (ref 32.0–36.0)
MCV: 90.7 fL (ref 80.0–100.0)
MPV: 11.3 fL (ref 7.5–12.5)
Monocytes Relative: 7.5 %
NEUTROS PCT: 56.8 %
Neutro Abs: 3578 cells/uL (ref 1500–7800)
Platelets: 194 10*3/uL (ref 140–400)
RBC: 4.3 10*6/uL (ref 3.80–5.10)
RDW: 11.6 % (ref 11.0–15.0)
Total Lymphocyte: 33 %
WBC mixed population: 473 cells/uL (ref 200–950)
WBC: 6.3 10*3/uL (ref 3.8–10.8)

## 2017-11-05 LAB — HEPATITIS C ANTIBODY
Hepatitis C Ab: NONREACTIVE
SIGNAL TO CUT-OFF: 0.03 (ref <1.00)

## 2017-11-05 LAB — RPR: RPR: NONREACTIVE

## 2017-11-05 LAB — HIV ANTIBODY (ROUTINE TESTING W REFLEX): HIV: NONREACTIVE

## 2017-11-05 LAB — HEPATITIS B SURFACE ANTIGEN: HEP B S AG: NONREACTIVE

## 2017-11-10 LAB — HUMAN PAPILLOMAVIRUS, HIGH RISK: HPV DNA HIGH RISK: DETECTED — AB

## 2017-11-10 LAB — PAP THINPREP ASCUS RFLX HPV RFLX TYPE
C. trachomatis RNA, TMA: NOT DETECTED
N. gonorrhoeae RNA, TMA: NOT DETECTED

## 2017-11-30 ENCOUNTER — Other Ambulatory Visit: Payer: Self-pay | Admitting: Allergy and Immunology

## 2017-11-30 DIAGNOSIS — J3089 Other allergic rhinitis: Secondary | ICD-10-CM

## 2017-11-30 DIAGNOSIS — L500 Allergic urticaria: Secondary | ICD-10-CM

## 2017-12-28 ENCOUNTER — Ambulatory Visit: Payer: Federal, State, Local not specified - PPO | Admitting: Obstetrics & Gynecology

## 2017-12-28 ENCOUNTER — Encounter: Payer: Self-pay | Admitting: Obstetrics & Gynecology

## 2017-12-28 VITALS — BP 130/78

## 2017-12-28 DIAGNOSIS — R87810 Cervical high risk human papillomavirus (HPV) DNA test positive: Secondary | ICD-10-CM

## 2017-12-28 DIAGNOSIS — R87610 Atypical squamous cells of undetermined significance on cytologic smear of cervix (ASC-US): Secondary | ICD-10-CM

## 2017-12-28 NOTE — Patient Instructions (Signed)
1. ASCUS with positive high risk HPV cervical Counseling on High risk HPV positive.  Colposcopy procedure explained.  Normal colposcopy findings discussed with patient.  Pending HPV 16/18/45.  Will repeat Pap test in 6 months.  Recommend good nutrition with plenty of vegetables and folic acid supplements for best immune function to fight the HR HPV. Strict condom use recommended.  Riot, it was a pleasure meeting you today!  I will inform you of your results as soon as they are available.   Human Papillomavirus Human papillomavirus (HPV) is the most common sexually transmitted infection (STI). It easily spreads from person to person (is highly contagious). HPV infections cause genital warts. Certain types of HPV may cause cancers, including cancer of the lower part of the uterus (cervix), vagina, outer female genital area (vulva), penis, anus, and rectum. HPV may also cause cancers of the oral cavity, such as the throat, tongue, and tonsils. There are many types of HPV. It usually does not cause symptoms. However, sometimes there are wart-like lesions in the throat or warts in the genital area that you can see or feel. It is possible to be infected for long periods and pass HPV to others without knowing it. What are the causes? HPV is caused by a virus that spreads from person to person through sexual contact. This includes oral, vaginal, or anal sex. What increases the risk? The following factors may make you more likely to develop this condition:  Having unprotected oral, vaginal, or anal sex.  Having several sex partners.  Having a sex partner who has other sex partners.  Having or having had another STI.  Having a weak disease-fighting (immune) system.  Having damaged skin in the genital area.  What are the signs or symptoms? Most people who have HPV do not have any symptoms. If symptoms are present, they may include:  Wartlike lesions in the throat (from having oral sex).  Warts  on the infected skin or mucous membranes.  Genital warts that may itch, burn, bleed, or be painful during sexual intercourse.  How is this diagnosed? If wartlike lesions are present in the throat or if genital warts are present, your health care provider can usually diagnose HPV with a physical exam. Genital warts are easily seen. In females, tests may be used to diagnose HPV, including:  A Pap test. A Pap test takes a sample of cells from your cervix to check for cancer and HPV infection.  An HPV test. This is similar to a Pap test and involves taking a sample of cells from your cervix.  Using a scope to view the cervix (colposcopy). This may be done if a pelvic exam or Pap test is abnormal. A sample of tissue may be removed for testing (biopsy) during the colposcopy.  Currently, there is no test to detect HPV in males. How is this treated? There is no treatment for the virus itself. However, there are treatments for the health problems and symptoms HPV can cause. Your health care provider will monitor you closely after you are treated as HPV can come back and may need treatment again. Treatment for HPV may include:  Medicines, which may be injected or applied to genital warts in a cream, lotion, liquid or gel form.  Use of a probe to apply extreme cold (cryotherapy) to the genital warts.  Application of an intense beam of light (laser treatment) on the genital warts.  Use of a probe to apply extreme heat (electrocautery) on the genital  warts.  Surgery to remove the genital warts.  Follow these instructions at home: Medicines  Take over-the-counter and prescription medicines only as told by your health care provider. This include creams for itching or irritation.  Do not treat genital warts with medicines used for treating hand warts. General instructions  Do not touch or scratch the warts.  Do not have sex while you are being treated.  Do not douche or use tampons during  treatment (women).  Tell your sex partner about your infection. He or she may also need to be treated.  If you become pregnant, tell your health care provider that you have HPV. Your health care provider will monitor you closely during pregnancy to make sure your baby is safe.  Keep all follow-up visits as told by your health care provider. This is important. How is this prevented?  Talk with your health care provider about getting the HPV vaccines. These vaccines prevent some HPV infections and cancers. The vaccines are recommended for males and females between the ages of 289 and 5226. They will not work if you already have HPV, and they are not recommended for pregnant women.  After treatment, use condoms during sex to prevent future infections.  Have only one sex partner.  Have a sex partner who does not have other sex partners.  Get regular Pap tests as directed by your health care provider. Contact a health care provider if:  The treated skin becomes red, swollen, or painful.  You have a fever.  You feel generally ill.  You feel lumps or pimples sticking out in and around your genital area.  You develop bleeding of the vagina or the treatment area.  You have painful sexual intercourse. Summary  Human papillomavirus (HPV) is the most common sexually transmitted infection (STI) and is highly contagious.  Most people carrying HPV do not have any symptoms.  HPV can be prevented with vaccination. The vaccine is recommended for males and females between the ages of 719 and 8426.  There is no treatment for the virus itself. However, there are treatments for the health problems and symptoms HPV can cause. This information is not intended to replace advice given to you by your health care provider. Make sure you discuss any questions you have with your health care provider. Document Released: 11/15/2003 Document Revised: 08/03/2016 Document Reviewed: 08/03/2016 Elsevier Interactive  Patient Education  [employer]2018 Elsevier Inc.

## 2017-12-28 NOTE — Progress Notes (Signed)
    Brianna SalterHaley Garrison 07/15/1996 [id number]        22 y.o.  G0 Single  RP: ASCUS/HPV HR pos for Colposcopy  HPI: First Pap 11/04/2017 showed ASCUS/HPV HR pos.  Received Gardasil.  Full STI screen otherwise negative 11/04/2017.   OB History  Gravida Para Term Preterm AB Living  0 0 0 0 0 0  SAB TAB Ectopic Multiple Live Births  0 0 0 0 0    Past medical history,surgical history, problem list, medications, allergies, family history and social history were all reviewed and documented in the EPIC chart.   Directed ROS with pertinent positives and negatives documented in the history of present illness/assessment and plan.  Exam:  Vitals:   12/28/17 0939  BP: 130/78   General appearance:  Normal  Colposcopy Procedure Note Brianna ArenasHaley Garrison 12/28/2017  Indications: ASCUS/HPV HR positive  Procedure Details  The risks and benefits of the procedure and Verbal informed consent obtained.  Speculum placed in vagina and excellent visualization of cervix achieved, cervix swabbed x 3 with acetic acid solution.  Findings:  Cervix colposcopy: Physical Exam  Genitourinary:     Vaginal colposcopy: Normal  Vulvar colposcopy: Grossly normal  Perirectal colposcopy: Grossly normal  The cervix was sprayed with Hurricane before performing the cervical biopsies.  Specimens: HPV 16-18-45  Complications:  None . Plan:  Repeat Pap test in 6 months   Assessment/Plan:  22 y.o. G0P0000   1. ASCUS with positive high risk HPV cervical Counseling on High risk HPV positive.  Colposcopy procedure explained.  Normal colposcopy findings discussed with patient.  Pending HPV 16/18/45.  Will repeat Pap test in 6 months.  Recommend good nutrition with plenty of vegetables and folic acid supplements for best immune function to fight the HR HPV. Strict condom use recommended.  Counseling on above issues and coordination of care more than 50% for 15 minutes.  Genia DelMarie-Lyne Channing Yeager MD, 9:45 AM 12/28/2017

## 2017-12-28 NOTE — Addendum Note (Signed)
Addended by: Berna SpareASTILLO, Lucious Zou A on: 12/28/2017 10:33 AM   Modules accepted: Orders

## 2017-12-31 LAB — HPV TYPE 16 AND 18/45 RNA
HPV TYPE 18/45 RNA: NOT DETECTED
HPV Type 16 RNA: NOT DETECTED

## 2018-03-09 DIAGNOSIS — R319 Hematuria, unspecified: Secondary | ICD-10-CM | POA: Diagnosis not present

## 2018-03-09 DIAGNOSIS — R350 Frequency of micturition: Secondary | ICD-10-CM | POA: Diagnosis not present

## 2018-03-13 DIAGNOSIS — R1031 Right lower quadrant pain: Secondary | ICD-10-CM | POA: Diagnosis not present

## 2018-03-13 DIAGNOSIS — S31122A Laceration of abdominal wall with foreign body, epigastric region without penetration into peritoneal cavity, initial encounter: Secondary | ICD-10-CM | POA: Diagnosis not present

## 2018-03-13 DIAGNOSIS — Z888 Allergy status to other drugs, medicaments and biological substances status: Secondary | ICD-10-CM | POA: Diagnosis not present

## 2018-03-13 DIAGNOSIS — X58XXXA Exposure to other specified factors, initial encounter: Secondary | ICD-10-CM | POA: Diagnosis not present

## 2018-03-13 DIAGNOSIS — Z793 Long term (current) use of hormonal contraceptives: Secondary | ICD-10-CM | POA: Diagnosis not present

## 2018-03-13 DIAGNOSIS — N83202 Unspecified ovarian cyst, left side: Secondary | ICD-10-CM | POA: Diagnosis not present

## 2018-03-13 DIAGNOSIS — N390 Urinary tract infection, site not specified: Secondary | ICD-10-CM | POA: Diagnosis not present

## 2018-03-13 DIAGNOSIS — Z79899 Other long term (current) drug therapy: Secondary | ICD-10-CM | POA: Diagnosis not present

## 2018-03-13 DIAGNOSIS — R102 Pelvic and perineal pain: Secondary | ICD-10-CM | POA: Diagnosis not present

## 2018-04-24 ENCOUNTER — Other Ambulatory Visit: Payer: Self-pay | Admitting: Allergy and Immunology

## 2018-04-24 DIAGNOSIS — J3089 Other allergic rhinitis: Secondary | ICD-10-CM

## 2018-04-24 DIAGNOSIS — L500 Allergic urticaria: Secondary | ICD-10-CM

## 2018-08-27 ENCOUNTER — Other Ambulatory Visit: Payer: Self-pay | Admitting: *Deleted

## 2018-08-27 ENCOUNTER — Telehealth: Payer: Self-pay | Admitting: *Deleted

## 2018-08-27 ENCOUNTER — Other Ambulatory Visit: Payer: Self-pay | Admitting: Allergy and Immunology

## 2018-08-27 DIAGNOSIS — J3089 Other allergic rhinitis: Secondary | ICD-10-CM

## 2018-08-27 DIAGNOSIS — L500 Allergic urticaria: Secondary | ICD-10-CM

## 2018-08-27 NOTE — Telephone Encounter (Signed)
Patient called and scheduled an appointment for 12/24 in [place], patient asked for a refill on her medications for the time being. Please advise

## 2018-08-27 NOTE — Telephone Encounter (Signed)
Called and spoke with the patient. The patient is fine with waiting until her appointment to get medications refilled so that it can be sent [place] a pharmacy [place]. Patient assured that they had enough medication to last until their appointment on the 24th of December.

## 2018-08-31 ENCOUNTER — Encounter: Payer: Self-pay | Admitting: Allergy and Immunology

## 2018-08-31 ENCOUNTER — Ambulatory Visit (INDEPENDENT_AMBULATORY_CARE_PROVIDER_SITE_OTHER): Payer: Federal, State, Local not specified - PPO | Admitting: Allergy and Immunology

## 2018-08-31 VITALS — BP 104/68 | HR 92 | Resp 17 | Ht 69.69 in | Wt 164.0 lb

## 2018-08-31 DIAGNOSIS — L500 Allergic urticaria: Secondary | ICD-10-CM | POA: Diagnosis not present

## 2018-08-31 DIAGNOSIS — J4531 Mild persistent asthma with (acute) exacerbation: Secondary | ICD-10-CM

## 2018-08-31 DIAGNOSIS — J3089 Other allergic rhinitis: Secondary | ICD-10-CM

## 2018-08-31 MED ORDER — PREDNISONE 1 MG PO TABS: 10.0000 mg | ORAL_TABLET | Freq: Every day | ORAL | Status: AC

## 2018-08-31 MED ORDER — MONTELUKAST SODIUM 10 MG PO TABS: 10.0000 mg | ORAL_TABLET | Freq: Every evening | ORAL | 0 refills | Status: DC

## 2018-08-31 MED ORDER — FLUTICASONE PROPIONATE HFA 110 MCG/ACT IN AERO: 2.0000 | INHALATION_SPRAY | Freq: Two times a day (BID) | RESPIRATORY_TRACT | 5 refills | Status: DC

## 2018-08-31 MED ORDER — LEVOCETIRIZINE DIHYDROCHLORIDE 5 MG PO TABS: 5.0000 mg | ORAL_TABLET | Freq: Every evening | ORAL | 0 refills | Status: DC

## 2018-08-31 NOTE — Patient Instructions (Addendum)
Recurrent urticaria  For now, continue montelukast 10 mg daily and levocetirizine 5 mg daily as needed.  Instructions have been discussed and provided for H1/H2 receptor blockade with titration to find lowest effective dose.  To avoid diminishing benefit with daily use (tachyphylaxis) of second generation antihistamine, consider alternating every few months between fexofenadine (Allegra) and levocetirizine (Xyzal).  If this problem progresses, will consider omalizumab (Xolair) injections.  Perennial and seasonal allergic rhinitis  Continue appropriate allergen avoidance measures, levocetirizine as needed, and montelukast 10 mg daily.  Mild persistent asthma Mild exacerbation due to aeroallergen exposure.    Prednisone has been provided, 20 mg x 4 days, 10 mg x1 day, then stop.  For now, and during respiratory tract infections or asthma flares, add Flovent 110g 2 inhalations 2 times per day until symptoms have returned to baseline.  To maximize pulmonary deposition, a spacer has been provided along with instructions for its proper administration with an HFA inhaler.  Continue montelukast 10 mg daily and albuterol, 1 to 2 inhalations every 4-6 hours if needed.  Subjective and objective measures of pulmonary function will be followed and the treatment plan will be adjusted accordingly.   Return in about 3 months (around 11/30/2018), or if symptoms worsen or fail to improve.

## 2018-08-31 NOTE — Assessment & Plan Note (Addendum)
Mild exacerbation due to aeroallergen exposure.    Prednisone has been provided, 20 mg x 4 days, 10 mg x1 day, then stop.  For now, and during respiratory tract infections or asthma flares, add Flovent 110g 2 inhalations 2 times per day until symptoms have returned to baseline.  To maximize pulmonary deposition, a spacer has been provided along with instructions for its proper administration with an HFA inhaler.  Continue montelukast 10 mg daily and albuterol, 1 to 2 inhalations every 4-6 hours if needed.  Subjective and objective measures of pulmonary function will be followed and the treatment plan will be adjusted accordingly.

## 2018-08-31 NOTE — Assessment & Plan Note (Signed)
   Continue appropriate allergen avoidance measures, levocetirizine as needed, and montelukast 10 mg daily.

## 2018-08-31 NOTE — Progress Notes (Signed)
Follow-up Note  RE: Brianna ArenasHaley Garrison MRN: [id number] DOB: 08/30/1996 Date of Office Visit: 08/31/2018  Primary care provider: Patient, No Pcp Per Referring provider: No ref. provider found  History of present illness: Brianna Garrison is a 22 y.o. female with chronic idiopathic urticaria, asthma, and allergic rhinitis presenting today for follow-up.  She is previously seen in this clinic in October 2018 by Dr. Delorse LekPadgett.  She reports that she still requires montelukast 10 mg daily and levocetirizine 5 mg daily in order to suppress the urticaria.  While taking these 2 medications the urticaria is well suppressed, however if she misses a few doses the urticaria recurs.  She has not experienced concomitant angioedema, cardiopulmonary symptoms, or GI symptoms.  She reports that [place] and began to experience more frequent asthma symptoms despite compliance with montelukast 10 mg daily.  She has required albuterol daily over the past 3 days.  She typically requires albuterol once every 1 to 2 months on average.  She does not report fevers, chills, or discolored mucus production. She has no nasal/sinus allergy symptom complaints today.  Assessment and plan: Recurrent urticaria  For now, continue montelukast 10 mg daily and levocetirizine 5 mg daily as needed.  Instructions have been discussed and provided for H1/H2 receptor blockade with titration to find lowest effective dose.  To avoid diminishing benefit with daily use (tachyphylaxis) of second generation antihistamine, consider alternating every few months between fexofenadine (Allegra) and levocetirizine (Xyzal).  If this problem progresses, will consider omalizumab (Xolair) injections.  Perennial and seasonal allergic rhinitis  Continue appropriate allergen avoidance measures, levocetirizine as needed, and montelukast 10 mg daily.  Mild persistent asthma Mild exacerbation due to  aeroallergen exposure.    Prednisone has been provided, 20 mg x 4 days, 10 mg x1 day, then stop.  For now, and during respiratory tract infections or asthma flares, add Flovent 110g 2 inhalations 2 times per day until symptoms have returned to baseline.  To maximize pulmonary deposition, a spacer has been provided along with instructions for its proper administration with an HFA inhaler.  Continue montelukast 10 mg daily and albuterol, 1 to 2 inhalations every 4-6 hours if needed.  Subjective and objective measures of pulmonary function will be followed and the treatment plan will be adjusted accordingly.   Meds ordered this encounter  Medications  . levocetirizine (XYZAL) 5 MG tablet    Sig: Take 1 tablet (5 mg total) by mouth every evening.    Dispense:  30 tablet    Refill:  0  . montelukast (SINGULAIR) 10 MG tablet    Sig: Take 1 tablet (10 mg total) by mouth every evening.    Dispense:  30 tablet    Refill:  0  . predniSONE (DELTASONE) tablet 10 mg  . fluticasone (FLOVENT HFA) 110 MCG/ACT inhaler    Sig: Inhale 2 puffs into the lungs 2 (two) times daily.    Dispense:  1 Inhaler    Refill:  5    Diagnostics: Spirometry reveals an FVC of 3.55 L (79% predicted) and an FEV1 of 2.62 L (67% predicted) with significant (380 mL, 15%) postbronchodilator improvement.  This study was performed while the patient was asymptomatic.  Please see scanned spirometry results for details.    Physical examination: Blood pressure 104/68, pulse 92, resp. rate 17, height 5' 9.69" (1.77 m), weight 164 lb (74.4 kg), SpO2 96 %.  General: Alert, interactive, in  no acute distress. HEENT: TMs pearly gray, turbinates moderately edematous with crusty discharge, post-pharynx moderately erythematous. Neck: Supple without lymphadenopathy. Lungs: Mildly decreased breath sounds bilaterally without wheezing, rhonchi or rales. CV: Normal S1, S2 without murmurs. Skin: Warm and dry, without lesions or  rashes.  The following portions of the patient's history were reviewed and updated as appropriate: allergies, current medications, past family history, past medical history, past social history, past surgical history and problem list.  Allergies as of 08/31/2018      Reactions   Chlorine Itching      Medication List       Accurate as of August 31, 2018  1:56 PM. Always use your most recent med list.        albuterol 108 (90 Base) MCG/ACT inhaler Commonly known as:  PROVENTIL HFA;VENTOLIN HFA Inhale 2 puffs into the lungs as needed for wheezing or shortness of breath.   fluticasone 110 MCG/ACT inhaler Commonly known as:  FLOVENT HFA Inhale 2 puffs into the lungs 2 (two) times daily.   ibuprofen 800 MG tablet Commonly known as:  ADVIL,MOTRIN Take 1 tablet (800 mg total) by mouth 3 (three) times daily.   levocetirizine 5 MG tablet Commonly known as:  XYZAL Take 1 tablet (5 mg total) by mouth every evening.   montelukast 10 MG tablet Commonly known as:  SINGULAIR Take 1 tablet (10 mg total) by mouth every evening.   norethindrone-ethinyl estradiol 1-20 MG-MCG tablet Commonly known as:  JUNEL FE 1/20 Take 1 tablet by mouth daily.       Allergies  Allergen Reactions  . Chlorine Itching   Review of systems: Review of systems negative except as noted in HPI / PMHx or noted below: Constitutional: Negative.  HENT: Negative.   Eyes: Negative.  Respiratory: Negative.   Cardiovascular: Negative.  Gastrointestinal: Negative.  Genitourinary: Negative.  Musculoskeletal: Negative.  Neurological: Negative.  Endo/Heme/Allergies: Negative.  Cutaneous: Negative.  Past Medical History:  Diagnosis Date  . Asthma   . Eczema   . Hives   . Ovarian cyst     Family History  Problem Relation Age of Onset  . Eczema Father   . Asthma Sister   . Eczema Sister   . Hypothyroidism Mother   . Hypertension Maternal Grandmother   . Hypercholesterolemia Maternal Grandmother    . Colon cancer Maternal Grandfather   . Prostate cancer Maternal Grandfather   . Prostate cancer Paternal Grandfather   . Allergic rhinitis Neg Hx   . Urticaria Neg Hx   . Immunodeficiency Neg Hx   . Atopy Neg Hx   . Angioedema Neg Hx     Social History   Socioeconomic History  . Marital status: Single    Spouse name: Not on file  . Number of children: Not on file  . Years of education: Not on file  . Highest education level: Not on file  Occupational History  . Not on file  Social Needs  . Financial resource strain: Not on file  . Food insecurity:    Worry: Not on file    Inability: Not on file  . Transportation needs:    Medical: Not on file    Non-medical: Not on file  Tobacco Use  . Smoking status: Never Smoker  . Smokeless tobacco: Never Used  Substance and Sexual Activity  . Alcohol use: Yes  . Drug use: No  . Sexual activity: Yes    Birth control/protection: Condom    Comment: intercourse age 27. sexual  partners more than 5  Lifestyle  . Physical activity:    Days per week: Not on file    Minutes per session: Not on file  . Stress: Not on file  Relationships  . Social connections:    Talks on phone: Not on file    Gets together: Not on file    Attends religious service: Not on file    Active member of club or organization: Not on file    Attends meetings of clubs or organizations: Not on file    Relationship status: Not on file  . Intimate partner violence:    Fear of current or ex partner: Not on file    Emotionally abused: Not on file    Physically abused: Not on file    Forced sexual activity: Not on file  Other Topics Concern  . Not on file  Social History Narrative  . Not on file    I appreciate the opportunity to take part in [employer]aley's care. Please do not hesitate to contact me with questions.  Sincerely,   R. Jorene Guestarter Dejai Schubach, MD

## 2018-08-31 NOTE — Assessment & Plan Note (Addendum)
   For now, continue montelukast 10 mg daily and levocetirizine 5 mg daily as needed.  Instructions have been discussed and provided for H1/H2 receptor blockade with titration to find lowest effective dose.  To avoid diminishing benefit with daily use (tachyphylaxis) of second generation antihistamine, consider alternating every few months between fexofenadine (Allegra) and levocetirizine (Xyzal).  If this problem progresses, will consider omalizumab (Xolair) injections.

## 2018-09-03 ENCOUNTER — Other Ambulatory Visit: Payer: Self-pay

## 2018-09-03 MED ORDER — MONTELUKAST SODIUM 10 MG PO TABS: 10.0000 mg | ORAL_TABLET | Freq: Every evening | ORAL | 5 refills | Status: DC

## 2018-09-03 MED ORDER — LEVOCETIRIZINE DIHYDROCHLORIDE 5 MG PO TABS: 5.0000 mg | ORAL_TABLET | Freq: Every evening | ORAL | 5 refills | Status: DC

## 2018-09-03 MED ORDER — ALBUTEROL SULFATE HFA 108 (90 BASE) MCG/ACT IN AERS: 2.0000 | INHALATION_SPRAY | RESPIRATORY_TRACT | 1 refills | Status: DC | PRN

## 2018-09-03 NOTE — Progress Notes (Signed)
Patient was seen 08/31/2018 and is only in town for a week. Patient needed her medication so a refill was sent to a pharmacy here and she asked if we could send refills to pharmacy in [place]. Refills have been sent and placed on hold until patient needs them.

## 2018-11-08 ENCOUNTER — Encounter: Payer: Federal, State, Local not specified - PPO | Admitting: Women's Health

## 2019-01-03 ENCOUNTER — Other Ambulatory Visit: Payer: Self-pay | Admitting: Women's Health

## 2019-01-03 DIAGNOSIS — Z30011 Encounter for initial prescription of contraceptive pills: Secondary | ICD-10-CM

## 2019-01-05 ENCOUNTER — Telehealth: Payer: Self-pay | Admitting: *Deleted

## 2019-01-05 DIAGNOSIS — Z30011 Encounter for initial prescription of contraceptive pills: Secondary | ICD-10-CM

## 2019-01-05 MED ORDER — NORETHIN ACE-ETH ESTRAD-FE 1-20 MG-MCG PO TABS: 1.0000 | ORAL_TABLET | Freq: Every day | ORAL | 0 refills | Status: AC

## 2019-01-05 NOTE — Telephone Encounter (Signed)
Patient called requesting refill on birth control pills, annual exam scheduled on 01/24/19. Rx sent.

## 2019-01-24 ENCOUNTER — Encounter: Payer: Federal, State, Local not specified - PPO | Admitting: Women's Health

## 2019-03-27 ENCOUNTER — Other Ambulatory Visit: Payer: Self-pay | Admitting: Women's Health

## 2019-03-27 ENCOUNTER — Other Ambulatory Visit: Payer: Self-pay | Admitting: Allergy and Immunology

## 2019-03-27 DIAGNOSIS — Z30011 Encounter for initial prescription of contraceptive pills: Secondary | ICD-10-CM

## 2019-03-28 NOTE — Telephone Encounter (Signed)
Refilled montelukast and levocetirizine. Pt needs ov for any additional refills.

## 2019-03-31 DIAGNOSIS — Z1151 Encounter for screening for human papillomavirus (HPV): Secondary | ICD-10-CM | POA: Diagnosis not present

## 2019-03-31 DIAGNOSIS — Z113 Encounter for screening for infections with a predominantly sexual mode of transmission: Secondary | ICD-10-CM | POA: Diagnosis not present

## 2019-03-31 DIAGNOSIS — Z01419 Encounter for gynecological examination (general) (routine) without abnormal findings: Secondary | ICD-10-CM | POA: Diagnosis not present

## 2019-03-31 DIAGNOSIS — R87610 Atypical squamous cells of undetermined significance on cytologic smear of cervix (ASC-US): Secondary | ICD-10-CM | POA: Diagnosis not present

## 2019-06-16 ENCOUNTER — Other Ambulatory Visit: Payer: Self-pay

## 2019-06-16 ENCOUNTER — Ambulatory Visit (INDEPENDENT_AMBULATORY_CARE_PROVIDER_SITE_OTHER): Payer: Federal, State, Local not specified - PPO | Admitting: Family Medicine

## 2019-06-16 ENCOUNTER — Encounter: Payer: Self-pay | Admitting: Family Medicine

## 2019-06-16 DIAGNOSIS — J4530 Mild persistent asthma, uncomplicated: Secondary | ICD-10-CM

## 2019-06-16 DIAGNOSIS — J3089 Other allergic rhinitis: Secondary | ICD-10-CM | POA: Diagnosis not present

## 2019-06-16 DIAGNOSIS — L500 Allergic urticaria: Secondary | ICD-10-CM

## 2019-06-16 MED ORDER — LEVOCETIRIZINE DIHYDROCHLORIDE 5 MG PO TABS: ORAL_TABLET | ORAL | 1 refills | Status: DC

## 2019-06-16 MED ORDER — ALBUTEROL SULFATE HFA 108 (90 BASE) MCG/ACT IN AERS: 2.0000 | INHALATION_SPRAY | RESPIRATORY_TRACT | 2 refills | Status: DC | PRN

## 2019-06-16 MED ORDER — MONTELUKAST SODIUM 10 MG PO TABS: ORAL_TABLET | ORAL | 1 refills | Status: DC

## 2019-06-16 NOTE — Progress Notes (Addendum)
RE: Brianna Garrison MRN: [id number] DOB: 18-Nov-1995 Date of Telemedicine Visit: 06/16/2019  Referring provider: No ref. provider found Primary care provider: Patient, No Pcp Per  Chief Complaint: Allergies and Asthma   Telemedicine Follow Up Visit via Telephone: I connected with Brianna Garrison for a follow up on 06/16/19 by telephone and verified that I am speaking with the correct person using two identifiers.   I discussed the limitations, risks, security and privacy concerns of performing an evaluation and management service by telephone and the availability of in person appointments. I also discussed with the patient that there may be a patient responsible charge related to this service. The patient expressed understanding and agreed to proceed.  Patient is at home  Provider is at the office.  Visit start time: 2:57 Visit end time: 3:30  History of Present Illness: She is a 23 y.o. female, who is being followed for asthma, allergic rhinitis, and recurrent urticaria. Her previous allergy office visit was on 08/31/2018 with Dr. Nunzio Cobbs. At today's visit, she reports that her asthma has been well controlled with no shortness of breath, cough, or wheeze with activity or rest.  She continues montelukast 10 mg once a day and uses her albuterol inhaler about once a week.  Allergic rhinitis is reported as well controlled with levocetirizine once a day.  She is not currently using Flonase or saline nasal spray.  Chronic urticaria is reported as well controlled with no breakouts while continuing on montelukast once a day and levocetirizine once a day.  Her current medications are listed in the chart.    Assessment and Plan:  Asthma Continue montelukast 10 mg once a day to prevent cough or wheeze Continue albuterol 2 puffs every 4 hours as needed for cough or wheeze For asthma flare, begin Flovent 110-2 puffs twice a day with a spacer for 2 weeks or until cough and wheeze free.  Allergic rhinitis   Continue levocetirizine once a day as needed for a runny nose Continue Flonase 1-2 sprays in each nostril once a day as needed for a stuffy nsoe Consider saline nasal rinses as needed for nasal symptoms. Use this before any medicated nasal sprays for best result  Recurrent urticaria Continue H1H2 blockade using the least amount of medication while remaining hive free Continue montelukast 10 mg once a day as above If your symptoms re-occur, begin a journal of events that occurred for up to 6 hours before your symptoms began including foods and beverages consumed, soaps or perfumes you had contact with, and medications.   Call the clinic if this treatment plan is not working well for you  Follow up in 6 months or sooner if needed.  Return in about 6 months (around 12/15/2019), or if symptoms worsen or fail to improve.  Meds ordered this encounter  Medications  . albuterol (PROAIR HFA) 108 (90 Base) MCG/ACT inhaler    Sig: Inhale 2 puffs into the lungs every 4 (four) hours as needed for wheezing or shortness of breath.    Dispense:  8 g    Refill:  2  . montelukast (SINGULAIR) 10 MG tablet    Sig: TAKE 1 TABLET BY MOUTH EVERY DAY IN THE EVENING    Dispense:  90 tablet    Refill:  1    Pt needs oc for additional refills  . levocetirizine (XYZAL) 5 MG tablet    Sig: TAKE 1 TABLET BY MOUTH EVERY DAY IN THE EVENING    Dispense:  90 tablet  Refill:  1    Pt needs ov for additional refills    Medication List:  Current Outpatient Medications  Medication Sig Dispense Refill  . albuterol (PROVENTIL HFA;VENTOLIN HFA) 108 (90 Base) MCG/ACT inhaler Inhale 2 puffs into the lungs as needed for wheezing or shortness of breath. 1 Inhaler 1  . ibuprofen (ADVIL,MOTRIN) 800 MG tablet Take 1 tablet (800 mg total) by mouth 3 (three) times daily. 21 tablet 0  . levocetirizine (XYZAL) 5 MG tablet TAKE 1 TABLET BY MOUTH EVERY DAY IN THE EVENING 90 tablet 1  . montelukast (SINGULAIR) 10 MG tablet TAKE 1  TABLET BY MOUTH EVERY DAY IN THE EVENING 90 tablet 1  . norethindrone-ethinyl estradiol (JUNEL FE 1/20) 1-20 MG-MCG tablet Take 1 tablet by mouth daily. 84 tablet 0  . albuterol (PROAIR HFA) 108 (90 Base) MCG/ACT inhaler Inhale 2 puffs into the lungs every 4 (four) hours as needed for wheezing or shortness of breath. 8 g 2  . fluticasone (FLOVENT HFA) 110 MCG/ACT inhaler Inhale 2 puffs into the lungs 2 (two) times daily. (Patient not taking: Reported on 06/16/2019) 1 Inhaler 5                       Allergies: Allergies  Allergen Reactions  . Chlorine Itching   I reviewed her past medical history, social history, family history, and environmental history and no significant changes have been reported from previous visit on 08/11/2018.  Objective: Physical Exam Not obtained as encounter was done via telephone.   Previous notes and tests were reviewed.  I discussed the assessment and treatment plan with the patient. The patient was provided an opportunity to ask questions and all were answered. The patient agreed with the plan and demonstrated an understanding of the instructions.   The patient was advised to call back or seek an in-person evaluation if the symptoms worsen or if the condition fails to improve as anticipated.  I provided 33 minutes of non-face-to-face time during this encounter.  It was my pleasure to participate in [facility] [facility] care today. Please feel free to contact me with any questions or concerns.   Sincerely,  Gareth Morgan, FNP  ________________________________________________  I have provided oversight concerning Brianna Garrison's evaluation and treatment of this patient's health issues addressed during today's encounter.  I agree with the assessment and therapeutic plan as outlined in the note.   Signed,   R Edgar Frisk, MD

## 2019-06-16 NOTE — Patient Instructions (Addendum)
Asthma Continue montelukast 10 mg once a day to prevent cough or wheeze Continue albuterol 2 puffs every 4 hours as needed for cough or wheeze For asthma flare, begin Flovent 110-2 puffs twice a day with a spacer for 2 weeks or until cough and wheeze free.  Allergic rhinitis  Continue levocetirizine once a day as needed for a runny nose Continue Flonase 1-2 sprays in each nostril once a day as needed for a stuffy nsoe Consider saline nasal rinses as needed for nasal symptoms. Use this before any medicated nasal sprays for best result  Recurrent urticaria Continue H1H2 blockade using the least amount of medication while remaining hive free Continue montelukast 10 mg once a day as above If your symptoms re-occur, begin a journal of events that occurred for up to 6 hours before your symptoms began including foods and beverages consumed, soaps or perfumes you had contact with, and medications.   Call the clinic if this treatment plan is not working well for you  Follow up in 6 months or sooner if needed.

## 2019-09-19 DIAGNOSIS — Z03818 Encounter for observation for suspected exposure to other biological agents ruled out: Secondary | ICD-10-CM | POA: Diagnosis not present

## 2019-09-19 DIAGNOSIS — Z20828 Contact with and (suspected) exposure to other viral communicable diseases: Secondary | ICD-10-CM | POA: Diagnosis not present

## 2019-09-30 ENCOUNTER — Ambulatory Visit (INDEPENDENT_AMBULATORY_CARE_PROVIDER_SITE_OTHER): Payer: Federal, State, Local not specified - PPO | Admitting: Family Medicine

## 2019-09-30 ENCOUNTER — Encounter: Payer: Self-pay | Admitting: Family Medicine

## 2019-09-30 ENCOUNTER — Ambulatory Visit: Payer: Federal, State, Local not specified - PPO

## 2019-09-30 ENCOUNTER — Other Ambulatory Visit: Payer: Self-pay

## 2019-09-30 VITALS — BP 108/68 | HR 94 | Temp 98.0°F | Resp 16 | Ht 69.0 in | Wt 173.6 lb

## 2019-09-30 DIAGNOSIS — L500 Allergic urticaria: Secondary | ICD-10-CM | POA: Diagnosis not present

## 2019-09-30 DIAGNOSIS — J4540 Moderate persistent asthma, uncomplicated: Secondary | ICD-10-CM

## 2019-09-30 DIAGNOSIS — J4520 Mild intermittent asthma, uncomplicated: Secondary | ICD-10-CM | POA: Diagnosis not present

## 2019-09-30 DIAGNOSIS — J3089 Other allergic rhinitis: Secondary | ICD-10-CM

## 2019-09-30 MED ORDER — FLOVENT HFA 110 MCG/ACT IN AERO: INHALATION_SPRAY | RESPIRATORY_TRACT | 5 refills | Status: DC

## 2019-09-30 NOTE — Patient Instructions (Addendum)
Asthma Begin Flovent 110-2 puffs twice a day with a spacer to prevent cough or wheeze Continue montelukast 10 mg once a day to prevent cough or wheeze Continue albuterol 2 puffs every 4 hours as needed for cough or wheeze Continue albuterol 2 puffs 5-15 minutes before exercise to prevent cough or wheeze  Allergic rhinitis  Continue levocetirizine once a day as needed for a runny nose Consider saline nasal rinses as needed for nasal symptoms. Use this before any medicated nasal sprays for best result  Recurrent urticaria Continue H1H2 blockade using the least amount of medication while remaining hive free Continue montelukast 10 mg once a day as above If your symptoms re-occur, begin a journal of events that occurred for up to 6 hours before your symptoms began including foods and beverages consumed, soaps or perfumes you had contact with, and medications.   Call the clinic if this treatment plan is not working well for you  Follow up in 2 months or sooner if needed.

## 2019-09-30 NOTE — Progress Notes (Signed)
[facility] [address] [place] [address] Dept: [phone]  FOLLOW UP NOTE  Patient ID: Brianna Garrison, female    DOB: 1996/05/11  Age: 24 y.o. MRN: [id number] Date of Office Visit: 09/30/2019  Assessment  Chief Complaint: Asthma  HPI Brianna Garrison is a 24 year old female who presents to the clinic today for a follow-up visit.  She was last seen in this clinic on 06/16/2019 for evaluation of hives, allergic rhinitis, and asthma.  At today's visit she reports that over the last year she has been experiencing chest tightness, shortness of breath with activity, and occasional wheezing, and no cough.  She continues montelukast once a day and uses albuterol 2 times a week in addition to albuterol before exercise.  Allergic rhinitis is reported as well controlled with levocetirizine 5 mg once a day and she is not using Flonase at this time.  Urticaria is reported as none well controlled with montelukast and levocetirizine once a day.  She reports frequent pressure hives which are not itchy and resolve within several minutes.  Her current medications are listed in the chart.   Drug Allergies:  Allergies  Allergen Reactions  . Chlorine Itching    Physical Exam: BP 108/68 (BP Location: Left Arm, Patient Position: Sitting, Cuff Size: Normal)   Pulse 94   Temp 98 F (36.7 C) (Temporal)   Resp 16   Ht 5\' 9"  (1.753 m)   Wt 173 lb 9.6 oz (78.7 kg)   SpO2 97%   BMI 25.64 kg/m    Physical Exam Vitals reviewed.  Constitutional:      Appearance: Normal appearance.  HENT:     Head: Normocephalic and atraumatic.     Right Ear: Tympanic membrane normal.     Left Ear: Tympanic membrane normal.     Nose:     Comments: Bilateral nares slightly erythematous with no nasal drainage noted.  Pharynx normal.  Ears normal.  Eyes normal.    Mouth/Throat:     Pharynx: Oropharynx is clear.  Eyes:     Conjunctiva/sclera: Conjunctivae normal.  Cardiovascular:     Rate and Rhythm: Normal rate and regular  rhythm.     Heart sounds: Normal heart sounds. No murmur.  Pulmonary:     Effort: Pulmonary effort is normal.     Breath sounds: Normal breath sounds.     Comments: Lungs clear to auscultation Musculoskeletal:        General: Normal range of motion.     Cervical back: Normal range of motion and neck supple.  Skin:    General: Skin is warm and dry.  Neurological:     Mental Status: She is alert and oriented to person, place, and time.  Psychiatric:        Mood and Affect: Mood normal.        Behavior: Behavior normal.        Thought Content: Thought content normal.        Judgment: Judgment normal.     Diagnostics: FVC 4.59, FEV1 3.62. Predicted FVC 4.41, predicted FEV1 3.77. Spirometry indicates normal ventilatory function.   Assessment and Plan: 1. Moderate persistent asthma without complication   2. Recurrent urticaria   3. Perennial and seasonal allergic rhinitis     No orders of the defined types were placed in this encounter.   Patient Instructions  Asthma Begin Flovent 110-2 puffs twice a day with a spacer to prevent cough or wheeze Continue montelukast 10 mg once a day to prevent cough  or wheeze Continue albuterol 2 puffs every 4 hours as needed for cough or wheeze Continue albuterol 2 puffs 5-15 minutes before exercise to prevent cough or wheeze  Allergic rhinitis  Continue levocetirizine once a day as needed for a runny nose Consider saline nasal rinses as needed for nasal symptoms. Use this before any medicated nasal sprays for best result  Recurrent urticaria Continue H1H2 blockade using the least amount of medication while remaining hive free Continue montelukast 10 mg once a day as above If your symptoms re-occur, begin a journal of events that occurred for up to 6 hours before your symptoms began including foods and beverages consumed, soaps or perfumes you had contact with, and medications.   Call the clinic if this treatment plan is not working well  for you  Follow up in 2 months or sooner if needed.   Return in about 2 months (around 11/28/2019), or if symptoms worsen or fail to improve.    Thank you for the opportunity to care for this patient.  Please do not hesitate to contact me with questions.  Thermon Leyland, FNP [facility]

## 2019-10-21 ENCOUNTER — Ambulatory Visit: Payer: Federal, State, Local not specified - PPO | Admitting: Family Medicine

## 2019-11-14 DIAGNOSIS — M25572 Pain in left ankle and joints of left foot: Secondary | ICD-10-CM | POA: Diagnosis not present

## 2019-11-28 DIAGNOSIS — M25572 Pain in left ankle and joints of left foot: Secondary | ICD-10-CM | POA: Diagnosis not present

## 2019-12-02 ENCOUNTER — Ambulatory Visit (INDEPENDENT_AMBULATORY_CARE_PROVIDER_SITE_OTHER): Payer: Federal, State, Local not specified - PPO | Admitting: Family Medicine

## 2019-12-02 ENCOUNTER — Other Ambulatory Visit: Payer: Self-pay

## 2019-12-02 ENCOUNTER — Encounter: Payer: Self-pay | Admitting: Family Medicine

## 2019-12-02 DIAGNOSIS — J4540 Moderate persistent asthma, uncomplicated: Secondary | ICD-10-CM

## 2019-12-02 DIAGNOSIS — J3089 Other allergic rhinitis: Secondary | ICD-10-CM

## 2019-12-02 DIAGNOSIS — L500 Allergic urticaria: Secondary | ICD-10-CM

## 2019-12-02 MED ORDER — FLOVENT HFA 110 MCG/ACT IN AERO: INHALATION_SPRAY | RESPIRATORY_TRACT | 5 refills | Status: AC

## 2019-12-02 MED ORDER — MONTELUKAST SODIUM 10 MG PO TABS: ORAL_TABLET | ORAL | 1 refills | Status: DC

## 2019-12-02 MED ORDER — ALBUTEROL SULFATE HFA 108 (90 BASE) MCG/ACT IN AERS: 2.0000 | INHALATION_SPRAY | RESPIRATORY_TRACT | 2 refills | Status: DC | PRN

## 2019-12-02 MED ORDER — LEVOCETIRIZINE DIHYDROCHLORIDE 5 MG PO TABS: ORAL_TABLET | ORAL | 1 refills | Status: AC

## 2019-12-02 NOTE — Progress Notes (Signed)
RE: Brianna Garrison MRN: [id number] DOB: 06-04-1996 Date of Telemedicine Visit: 12/02/2019  Referring provider: No ref. provider found Primary care provider: Patient, No Pcp Per  Chief Complaint: Asthma   Telemedicine Follow Up Visit via Telephone: I connected with Brianna Garrison for a follow up on 12/02/19 by telephone and verified that I am speaking with the correct person using two identifiers.   I discussed the limitations, risks, security and privacy concerns of performing an evaluation and management service by telephone and the availability of in person appointments. I also discussed with the patient that there may be a patient responsible charge related to this service. The patient expressed understanding and agreed to proceed.  Patient is at home   Provider is at the office.  Visit start time: 213 Visit end time: 80 Insurance consent/check in by: [facility] consent and medical assistant/nurse: Brianna Garrison  History of Present Illness: She is a 24 y.o. female, who is being followed for asthma, allergic rhinitis, and recurrent urticaria. Her previous allergy office visit was on 09/30/2019 with Brianna Garrison, [place].  At today's visit she reports her asthma has been well controlled with no shortness of breath, cough, or wheeze with activity or rest.  She continues montelukast 10 mg once a day, Flovent 110-2 puffs twice a day with a spacer, and rarely needs her albuterol.  Allergic rhinitis is reported as well controlled with levocetirizine once a day as needed as well as Flonase as needed.  Recurrent urticaria is reported as well controlled with no breakouts while continuing levocetirizine once a day and montelukast once a day.  Her current medications are listed in the chart.  Assessment and Plan: Brianna Garrison is a 24 y.o. female with: Patient Instructions  Asthma Continue Flovent 110-2 puffs twice a day with a spacer to prevent cough or wheeze Continue montelukast 10 mg once a day to prevent cough  or wheeze Continue albuterol 2 puffs every 4 hours as needed for cough or wheeze Continue albuterol 2 puffs 5-15 minutes before exercise to prevent cough or wheeze  Allergic rhinitis  Continue levocetirizine once a day as needed for a runny nose Consider saline nasal rinses as needed for nasal symptoms. Use this before any medicated nasal sprays for best result  Recurrent urticaria Continue H1H2 blockade using the least amount of medication while remaining hive free Continue montelukast 10 mg once a day as above If your symptoms re-occur, begin a journal of events that occurred for up to 6 hours before your symptoms began including foods and beverages consumed, soaps or perfumes you had contact with, and medications.   Call the clinic if this treatment plan is not working well for you  Follow up in 2 months or sooner if needed.    Return in about 6 months (around 06/03/2020), or if symptoms worsen or fail to improve.  Meds ordered this encounter  Medications  . fluticasone (FLOVENT HFA) 110 MCG/ACT inhaler    Sig: Inhale 2 puffs into the lungs twice daily with spacer to prevent cough or wheeze.    Dispense:  1 Inhaler    Refill:  5  . levocetirizine (XYZAL) 5 MG tablet    Sig: TAKE 1 TABLET BY MOUTH EVERY DAY IN THE EVENING    Dispense:  90 tablet    Refill:  1    Pt needs ov for additional refills  . montelukast (SINGULAIR) 10 MG tablet    Sig: TAKE 1 TABLET BY MOUTH EVERY DAY IN THE EVENING  Dispense:  90 tablet    Refill:  1    Pt needs oc for additional refills  . albuterol (PROAIR HFA) 108 (90 Base) MCG/ACT inhaler    Sig: Inhale 2 puffs into the lungs every 4 (four) hours as needed for wheezing or shortness of breath.    Dispense:  8 g    Refill:  2    Medication List:  Current Outpatient Medications  Medication Sig Dispense Refill  . albuterol (PROAIR HFA) 108 (90 Base) MCG/ACT inhaler Inhale 2 puffs into the lungs every 4 (four) hours as needed for wheezing or  shortness of breath. 8 g 2  . fluticasone (FLOVENT HFA) 110 MCG/ACT inhaler Inhale 2 puffs into the lungs twice daily with spacer to prevent cough or wheeze. 1 Inhaler 5  . levocetirizine (XYZAL) 5 MG tablet TAKE 1 TABLET BY MOUTH EVERY DAY IN THE EVENING 90 tablet 1  . montelukast (SINGULAIR) 10 MG tablet TAKE 1 TABLET BY MOUTH EVERY DAY IN THE EVENING 90 tablet 1  . norethindrone-ethinyl estradiol (JUNEL FE 1/20) 1-20 MG-MCG tablet Take 1 tablet by mouth daily. 84 tablet 0   Current Facility-Administered Medications  Medication Dose Route Frequency Provider Last Rate Last Admin  . predniSONE (DELTASONE) tablet 10 mg  10 mg Oral Q breakfast Bobbitt, Heywood Iles, MD       Allergies: Allergies  Allergen Reactions  . Chlorine Itching   I reviewed her past medical history, social history, family history, and environmental history and no significant changes have been reported from previous visit on 09/30/2019.  Objective: Physical Exam Not obtained as encounter was done via telephone.   Previous notes and tests were reviewed.  I discussed the assessment and treatment plan with the patient. The patient was provided an opportunity to ask questions and all were answered. The patient agreed with the plan and demonstrated an understanding of the instructions.   The patient was advised to call back or seek an in-person evaluation if the symptoms worsen or if the condition fails to improve as anticipated.  I provided 24 minutes of non-face-to-face time during this encounter.  It was my pleasure to participate in Brianna Garrison care today. Please feel free to contact me with any questions or concerns.   Sincerely,  Thermon Leyland, FNP

## 2019-12-02 NOTE — Patient Instructions (Signed)
Asthma Continue Flovent 110-2 puffs twice a day with a spacer to prevent cough or wheeze Continue montelukast 10 mg once a day to prevent cough or wheeze Continue albuterol 2 puffs every 4 hours as needed for cough or wheeze Continue albuterol 2 puffs 5-15 minutes before exercise to prevent cough or wheeze  Allergic rhinitis  Continue levocetirizine once a day as needed for a runny nose Consider saline nasal rinses as needed for nasal symptoms. Use this before any medicated nasal sprays for best result  Recurrent urticaria Continue H1H2 blockade using the least amount of medication while remaining hive free Continue montelukast 10 mg once a day as above If your symptoms re-occur, begin a journal of events that occurred for up to 6 hours before your symptoms began including foods and beverages consumed, soaps or perfumes you had contact with, and medications.   Call the clinic if this treatment plan is not working well for you  Follow up in 2 months or sooner if needed.

## 2019-12-12 ENCOUNTER — Telehealth: Payer: Self-pay | Admitting: *Deleted

## 2019-12-12 NOTE — Telephone Encounter (Signed)
Patient called has moved to [place] and has not yet established GYN MD yet. She is still taking birth control pills and asked if you would speak with her via phone regarding stopping pills?, not wanting to get pregnant yet, but tracking ovulation. Should have her schedule video visit?

## 2019-12-12 NOTE — Telephone Encounter (Signed)
Telephone call, reviewed coming off birth control pills with ovulation tracking, rhythm method and withdraw.  Planning to become engaged in the next year pregnancy not desired at this time.  Reviewed stopping birth control pills at end of pack, continue with ovulation tracking but encourage condoms in the meanwhile.  Reviewed withdraw is not as effective as condoms for prevention.  MVI daily encouraged.  Also congratulated on upcoming engagement.

## 2019-12-14 ENCOUNTER — Ambulatory Visit: Payer: Self-pay | Admitting: Family Medicine

## 2020-01-19 ENCOUNTER — Telehealth: Payer: Self-pay | Admitting: Family Medicine

## 2020-01-19 NOTE — Telephone Encounter (Signed)
Called and left a voicemail asking for patient to return call to inform which inhaler she needs refilled.

## 2020-01-19 NOTE — Telephone Encounter (Signed)
Patient called and needs to a have another inhaler called into [facility] in [place]. [phone].

## 2020-01-20 MED ORDER — ALBUTEROL SULFATE HFA 108 (90 BASE) MCG/ACT IN AERS: 2.0000 | INHALATION_SPRAY | RESPIRATORY_TRACT | 1 refills | Status: DC | PRN

## 2020-01-20 NOTE — Telephone Encounter (Signed)
Patient returned call and stated that she needed a refill for her Albuterol inhaler. Refill has been sent to the requested pharmacy. Patient verbalized understanding.

## 2020-01-20 NOTE — Telephone Encounter (Signed)
Called and left a message asking for the patient to return call.

## 2020-01-20 NOTE — Addendum Note (Signed)
Addended by: Dollene Cleveland R on: 01/20/2020 09:53 AM   Modules accepted: Orders

## 2020-02-10 ENCOUNTER — Other Ambulatory Visit: Payer: Self-pay

## 2020-02-10 ENCOUNTER — Telehealth: Payer: Self-pay | Admitting: Family Medicine

## 2020-02-10 NOTE — Telephone Encounter (Signed)
Can you please look at what is covered on her insurance drug formulary? Thank you

## 2020-02-10 NOTE — Telephone Encounter (Signed)
I called patient and told her Xyzal was OTC and we had some coupons as well but she states she rather get something else that her insurance can cover. We could try sending in allegra. Please advice.

## 2020-02-10 NOTE — Telephone Encounter (Signed)
Patient states insurance is not covering her levocetirizine and is requesting an alternative that will be covered

## 2020-02-13 NOTE — Telephone Encounter (Signed)
Since her insurance is not going to pay, I guess the best we can do at this time is give her the generic names and have her get them over the counter.  Some examples of over the counter antihistamines include Zyrtec (cetirizine), Xyzal (levocetirizine), Allegra (fexofenadine), and Claritin (loratidine). Thank you

## 2020-02-13 NOTE — Telephone Encounter (Signed)
Patient informed and verbalized understanding

## 2020-02-13 NOTE — Telephone Encounter (Signed)
Due to medications being OTC her insurance plan will not cover them. (zyrtec, xyzal, allegra, loratadine). Please advice.

## 2020-02-13 NOTE — Telephone Encounter (Signed)
Unable to reach patient. Left voicemail for patient to return call

## 2020-04-03 DIAGNOSIS — Z01419 Encounter for gynecological examination (general) (routine) without abnormal findings: Secondary | ICD-10-CM | POA: Diagnosis not present

## 2020-04-03 DIAGNOSIS — Z6824 Body mass index (BMI) 24.0-24.9, adult: Secondary | ICD-10-CM | POA: Diagnosis not present

## 2020-06-07 NOTE — Patient Instructions (Addendum)
Asthma Continue Flovent 110-2 puffs once a day with a spacer to prevent cough or wheeze Continue montelukast 10 mg once a day to prevent cough or wheeze Continue albuterol 2 puffs every 4 hours as needed for cough or wheeze Continue albuterol 2 puffs 5-15 minutes before exercise to prevent cough or wheeze For asthma flare, begin Flovent 110-2 puffs twice a day for 1-2 weeks or until cough and wheeze free, then resume previous dose of Flovent  Allergic rhinitis  Continue levocetirizine once a day as needed for a runny nose Consider saline nasal rinses as needed for nasal symptoms. Use this before any medicated nasal sprays for best result  Recurrent urticaria Continue H1H2 blockade using the least amount of medication while remaining hive free . Levocetirizine (Xyzal) 5 mg twice a day and famotidine (Pepcid) 20 mg twice a day. If no symptoms for 7-14 days then decrease to. . Levocetirizine (Xyzal) 5 mg twice a day and famotidine (Pepcid) 20 mg once a day.  If no symptoms for 7-14 days then decrease to. . Levocetirizine (Xyzal) 5 mg twice a day.  If no symptoms for 7-14 days then decrease to. . Levocetirizine (Xyzal) 5 mg once a day. Continue montelukast 10 mg once a day as above If your symptoms re-occur, begin a journal of events that occurred for up to 6 hours before your symptoms began including foods and beverages consumed, soaps or perfumes you had contact with, and medications.   Call the clinic if this treatment plan is not working well for you  Follow up in the clinic in 6 months or sooner if needed.

## 2020-06-07 NOTE — Progress Notes (Signed)
RE: Brianna Garrison MRN: [id number] DOB: 08-03-1996 Date of Telemedicine Visit: 06/08/2020  Referring provider: No ref. provider found Primary care provider: Patient, No Pcp Per  Chief Complaint: Asthma   Telemedicine Follow Up Visit via Telephone: I connected with Brianna Garrison for a follow up on 06/08/20 by telephone and verified that I am speaking with the correct person using two identifiers.   I discussed the limitations, risks, security and privacy concerns of performing an evaluation and management service by telephone and the availability of in person appointments. I also discussed with the patient that there may be a patient responsible charge related to this service. The patient expressed understanding and agreed to proceed.  Patient is at home  Provider is at the office.  Visit start time: 1055 Visit end time: 1110 Insurance consent/check in by: [facility] consent and medical assistant/nurse: Logan  History of Present Illness: She is a 24 y.o. female, who is being followed for asthma, allergic rhinitis, and urticaria. Her previous allergy office visit was on 12/02/2118 with Thermon Leyland, FNP.  At today's visit she reports her asthma has been well controlled with no shortness of breath, cough, or wheeze with activity or rest.  She continues to workout 2-3 times a day with no symptoms of asthma.  She continues montelukast 10 mg once a day, Flovent 110 2 puffs once a day, and albuterol about once every 2 months with relief of symptoms.  Allergic rhinitis is reported as well controlled with Xyzal 5 mg once a day.  She is not currently using Flonase or saline nasal rinses at this time.  Urticaria is reported as well controlled with Xyzal 5 mg once a day.  She does report that if she forgets to take Xyzal she begins to experience pruritus which resolves once she takes Xyzal 5 mg.  Her current medications are listed in the chart.  Assessment and Plan: Brianna Garrison is a 24 y.o. female with: Patient  Instructions  Asthma Continue Flovent 110-2 puffs once a day with a spacer to prevent cough or wheeze Continue montelukast 10 mg once a day to prevent cough or wheeze Continue albuterol 2 puffs every 4 hours as needed for cough or wheeze Continue albuterol 2 puffs 5-15 minutes before exercise to prevent cough or wheeze For asthma flare, begin Flovent 110-2 puffs twice a day for 1-2 weeks or until cough and wheeze free, then resume previous dose of Flovent  Allergic rhinitis  Continue levocetirizine once a day as needed for a runny nose Consider saline nasal rinses as needed for nasal symptoms. Use this before any medicated nasal sprays for best result  Recurrent urticaria Continue H1H2 blockade using the least amount of medication while remaining hive free . Levocetirizine (Xyzal) 5 mg twice a day and famotidine (Pepcid) 20 mg twice a day. If no symptoms for 7-14 days then decrease to. . Levocetirizine (Xyzal) 5 mg twice a day and famotidine (Pepcid) 20 mg once a day.  If no symptoms for 7-14 days then decrease to. . Levocetirizine (Xyzal) 5 mg twice a day.  If no symptoms for 7-14 days then decrease to. . Levocetirizine (Xyzal) 5 mg once a day. Continue montelukast 10 mg once a day as above If your symptoms re-occur, begin a journal of events that occurred for up to 6 hours before your symptoms began including foods and beverages consumed, soaps or perfumes you had contact with, and medications.   Call the clinic if this treatment plan is not working well  for you  Follow up in the clinic in 6 months or sooner if needed.    Return in about 6 months (around 12/07/2020), or if symptoms worsen or fail to improve.  Meds ordered this encounter  Medications  . montelukast (SINGULAIR) 10 MG tablet    Sig: TAKE 1 TABLET BY MOUTH EVERY DAY IN THE EVENING    Dispense:  90 tablet    Refill:  1    Medication List:  Current Outpatient Medications  Medication Sig Dispense Refill  . albuterol  (PROAIR HFA) 108 (90 Base) MCG/ACT inhaler Inhale 2 puffs into the lungs every 4 (four) hours as needed for wheezing or shortness of breath. 18 g 1  . fluticasone (FLOVENT HFA) 110 MCG/ACT inhaler Inhale 2 puffs into the lungs twice daily with spacer to prevent cough or wheeze. 1 Inhaler 5  . levocetirizine (XYZAL) 5 MG tablet TAKE 1 TABLET BY MOUTH EVERY DAY IN THE EVENING 90 tablet 1  . montelukast (SINGULAIR) 10 MG tablet TAKE 1 TABLET BY MOUTH EVERY DAY IN THE EVENING 90 tablet 1  . norethindrone-ethinyl estradiol (JUNEL FE 1/20) 1-20 MG-MCG tablet Take 1 tablet by mouth daily. 84 tablet 0   Current Facility-Administered Medications  Medication Dose Route Frequency Provider Last Rate Last Admin  . predniSONE (DELTASONE) tablet 10 mg  10 mg Oral Q breakfast Bobbitt, Heywood Iles, MD       Allergies: Allergies  Allergen Reactions  . Chlorine Itching   I reviewed her past medical history, social history, family history, and environmental history and no significant changes have been reported from previous visit on 12/02/2019.  Objective: Physical Exam Not obtained as encounter was done via telephone.   Previous notes and tests were reviewed.  I discussed the assessment and treatment plan with the patient. The patient was provided an opportunity to ask questions and all were answered. The patient agreed with the plan and demonstrated an understanding of the instructions.   The patient was advised to call back or seek an in-person evaluation if the symptoms worsen or if the condition fails to improve as anticipated.  I provided 15 minutes of non-face-to-face time during this encounter.  It was my pleasure to participate in Brianna Garrison care today. Please feel free to contact me with any questions or concerns.   Sincerely,  Thermon Leyland, FNP

## 2020-06-08 ENCOUNTER — Ambulatory Visit (INDEPENDENT_AMBULATORY_CARE_PROVIDER_SITE_OTHER): Payer: Federal, State, Local not specified - PPO | Admitting: Family Medicine

## 2020-06-08 ENCOUNTER — Encounter: Payer: Self-pay | Admitting: Family Medicine

## 2020-06-08 ENCOUNTER — Ambulatory Visit: Payer: Self-pay | Admitting: Family Medicine

## 2020-06-08 ENCOUNTER — Other Ambulatory Visit: Payer: Self-pay

## 2020-06-08 DIAGNOSIS — J4540 Moderate persistent asthma, uncomplicated: Secondary | ICD-10-CM

## 2020-06-08 DIAGNOSIS — J3089 Other allergic rhinitis: Secondary | ICD-10-CM | POA: Diagnosis not present

## 2020-06-08 DIAGNOSIS — L500 Allergic urticaria: Secondary | ICD-10-CM

## 2020-06-08 MED ORDER — MONTELUKAST SODIUM 10 MG PO TABS: ORAL_TABLET | ORAL | 1 refills | Status: DC

## 2020-11-28 ENCOUNTER — Other Ambulatory Visit: Payer: Self-pay | Admitting: Family Medicine

## 2021-02-27 ENCOUNTER — Other Ambulatory Visit: Payer: Self-pay | Admitting: Family Medicine

## 2021-03-27 ENCOUNTER — Other Ambulatory Visit: Payer: Self-pay | Admitting: Family Medicine

## 2021-03-27 ENCOUNTER — Telehealth: Payer: Self-pay

## 2021-03-27 NOTE — Telephone Encounter (Signed)
Noted! Thank you

## 2021-03-27 NOTE — Telephone Encounter (Signed)
Called and spoke to patient and informed her that this is a courtesy refill. Patient made an appointment with Nehemiah Settle.

## 2021-03-27 NOTE — Telephone Encounter (Signed)
Patient needed refills on Singular.She needed an appointment and scheduled an telephone visit with you for tomorrow as she is currently living in [place].

## 2021-03-27 NOTE — Patient Instructions (Addendum)
Asthma Continue montelukast 10 mg once a day to prevent cough or wheeze Continue albuterol 2 puffs every 4 hours as needed for cough or wheeze Continue albuterol 2 puffs 5-15 minutes before exercise to prevent cough or wheeze For asthma flare, begin Flovent 110-2 puffs twice a day for 1-2 weeks or until cough and wheeze free  Allergic rhinitis  Continue levocetirizine once a day as needed for a runny nose Consider saline nasal rinses as needed for nasal symptoms. Use this before any medicated nasal sprays for best result  Recurrent urticaria Continue H1H2 blockade using the least amount of medication while remaining hive free Levocetirizine (Xyzal) 5 mg twice a day and famotidine (Pepcid) 20 mg twice a day. If no symptoms for 7-14 days then decrease to. Levocetirizine (Xyzal) 5 mg twice a day and famotidine (Pepcid) 20 mg once a day.  If no symptoms for 7-14 days then decrease to. Levocetirizine (Xyzal) 5 mg twice a day.  If no symptoms for 7-14 days then decrease to. Levocetirizine (Xyzal) 5 mg once a day. Continue montelukast 10 mg once a day as above If your symptoms re-occur, begin a journal of events that occurred for up to 6 hours before your symptoms began including foods and beverages consumed, soaps or perfumes you had contact with, and medications.   Chlorine allergy Recommend avoiding chlorine. If reaction occurs again take a picture and call our office. If you have any other symptoms other than rash such as shortness of breath or difficulty breathing please go to the emergency room.  You may increase your Xyzal to twice a day to help with itching. Call the clinic if this treatment plan is not working well for you  Follow up in the clinic in 6 months or sooner if needed.

## 2021-03-28 ENCOUNTER — Other Ambulatory Visit: Payer: Self-pay | Admitting: Family

## 2021-03-28 ENCOUNTER — Encounter: Payer: Self-pay | Admitting: Family

## 2021-03-28 ENCOUNTER — Ambulatory Visit (INDEPENDENT_AMBULATORY_CARE_PROVIDER_SITE_OTHER): Payer: Federal, State, Local not specified - PPO | Admitting: Family

## 2021-03-28 ENCOUNTER — Other Ambulatory Visit: Payer: Self-pay

## 2021-03-28 VITALS — Ht 69.0 in | Wt 160.0 lb

## 2021-03-28 DIAGNOSIS — J3089 Other allergic rhinitis: Secondary | ICD-10-CM

## 2021-03-28 DIAGNOSIS — J4540 Moderate persistent asthma, uncomplicated: Secondary | ICD-10-CM | POA: Diagnosis not present

## 2021-03-28 DIAGNOSIS — L500 Allergic urticaria: Secondary | ICD-10-CM

## 2021-03-28 MED ORDER — FLUTICASONE PROPIONATE HFA 110 MCG/ACT IN AERO: INHALATION_SPRAY | RESPIRATORY_TRACT | 5 refills | Status: DC

## 2021-03-28 MED ORDER — MONTELUKAST SODIUM 10 MG PO TABS: 10.0000 mg | ORAL_TABLET | Freq: Every evening | ORAL | 5 refills | Status: DC

## 2021-03-28 MED ORDER — LEVOCETIRIZINE DIHYDROCHLORIDE 5 MG PO TABS: 5.0000 mg | ORAL_TABLET | Freq: Every evening | ORAL | 5 refills | Status: DC

## 2021-03-28 MED ORDER — ALBUTEROL SULFATE HFA 108 (90 BASE) MCG/ACT IN AERS: 2.0000 | INHALATION_SPRAY | RESPIRATORY_TRACT | 1 refills | Status: AC | PRN

## 2021-03-28 NOTE — Progress Notes (Signed)
RE: Brianna Garrison MRN: [id number] DOB: Nov 18, 1995 Date of Telemedicine Visit: 03/28/2021  Referring provider: No ref. provider found Primary care provider: Patient, No Pcp Per (Inactive)  Chief Complaint: Asthma (No flares or symptoms - ) and Allergic Reaction (Chlorine from pools - on her honeymoon she dipped her feet in the pool. She broke in a itchy rash - has used cortisone cream in the past helped a little. )   Telemedicine Follow Up Visit via Telephone: I connected with Brianna Garrison for a follow up on 03/28/21 by telephone and verified that I am speaking with the correct person using two identifiers.   I discussed the limitations, risks, security and privacy concerns of performing an evaluation and management service by telephone and the availability of in person appointments. I also discussed with the patient that there may be a patient responsible charge related to this service. The patient expressed understanding and agreed to proceed.  Patient is at home Provider is at the office.  Visit start time: 9:43 AM Visit end time: 10:06 AM Insurance consent/check in by: Dyasia N Medical consent and medical assistant/nurse: Diandra D.  History of Present Illness: She is a 25 y.o. female, who is being followed for asthma, allergic rhinitis, and recurrent urticaria. Her previous allergy office visit was on June 08, 2020 with Thermon Leyland, FNP.   Asthma is reported as controlled with montelukast 10 mg once a day and albuterol as needed.  She reports that now that she has been exercising daily she does not need to use her Flovent.  She does not feel like her asthma has been out of control for the past year.  She denies any coughing, wheezing, tightness in chest, shortness of breath, and nocturnal awakenings due to breathing problems.  Since her last office visit she has not required any systemic steroids and she has not made any trips to the emergency room or urgent care due to breathing problems.   She is not sure when she last used her albuterol inhaler but it was maybe 1 to 2 months ago.  Allergic rhinitis is reported as controlled with Xyzal 5 mg once a day.  She denies any rhinorrhea, nasal congestion, and postnasal drip.  She has not had any sinus infections since we last saw her.  Recurrent urticaria is reported as controlled with Xyzal 5 mg once a day.  She reports that this is not changed over in the past couple years.  If she does not take her Xyzal she will get hives.  She mentions that she has been trying to cut out dairy and gluten in her diet to see if this makes a difference.  She reports that she is slowly starting this and is only eating gluten free and dairy free meals 2 meals out of the day.  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 She reports this June while on her honeymoon she put her feet in the chlorinated pool because that is the only type of pool that they had. She developed a rash all over her body.  The rash lasted for 1 to 2 weeks.  When asked if it was a rash or hives.  She reports that she knows the difference between a rash and hives and that it was a rash.  She denies having any problems with getting hives if she touches cold water with her hands or touches ice cubes or ice packs.  She denied any other  concomitant cardiorespiratory and gastrointestinal symptoms.  Assessment and Plan: Brianna Garrison is a 25 y.o. female with: Patient Instructions  Asthma Continue montelukast 10 mg once a day to prevent cough or wheeze Continue albuterol 2 puffs every 4 hours as needed for cough or wheeze Continue albuterol 2 puffs 5-15 minutes before exercise to prevent cough or wheeze For asthma flare, begin Flovent 110-2 puffs twice a day for 1-2 weeks or until cough and wheeze free  Allergic rhinitis  Continue levocetirizine once a day as needed for a runny nose Consider saline nasal rinses as needed for nasal symptoms. Use this before any medicated  nasal sprays for best result  Recurrent urticaria Continue H1H2 blockade using the least amount of medication while remaining hive free Levocetirizine (Xyzal) 5 mg twice a day and famotidine (Pepcid) 20 mg twice a day. If no symptoms for 7-14 days then decrease to. Levocetirizine (Xyzal) 5 mg twice a day and famotidine (Pepcid) 20 mg once a day.  If no symptoms for 7-14 days then decrease to. Levocetirizine (Xyzal) 5 mg twice a day.  If no symptoms for 7-14 days then decrease to. Levocetirizine (Xyzal) 5 mg once a day. Continue montelukast 10 mg once a day as above If your symptoms re-occur, begin a journal of events that occurred for up to 6 hours before your symptoms began including foods and beverages consumed, soaps or perfumes you had contact with, and medications.   Chlorine allergy Recommend avoiding chlorine. If reaction occurs again take a picture and call our office. If you have any other symptoms other than rash such as shortness of breath or difficulty breathing please go to the emergency room.  You may increase your Xyzal to twice a day to help with itching. Call the clinic if this treatment plan is not working well for you  Follow up in the clinic in 6 months or sooner if needed.  Return in about 6 months (around 09/28/2021), or if symptoms worsen or fail to improve, for with spirometry.  Meds ordered this encounter  Medications   albuterol (PROAIR HFA) 108 (90 Base) MCG/ACT inhaler    Sig: Inhale 2 puffs into the lungs every 4 (four) hours as needed for wheezing or shortness of breath.    Dispense:  18 g    Refill:  1   montelukast (SINGULAIR) 10 MG tablet    Sig: Take 1 tablet (10 mg total) by mouth every evening.    Dispense:  30 tablet    Refill:  5   fluticasone (FLOVENT HFA) 110 MCG/ACT inhaler    Sig: Inhale 2 puffs twice a day for 1-2 weeks or until cough and wheeze free    Dispense:  12 g    Refill:  5   levocetirizine (XYZAL) 5 MG tablet    Sig: Take 1 tablet  (5 mg total) by mouth every evening.    Dispense:  30 tablet    Refill:  5    Lab Orders  No laboratory test(s) ordered today    Diagnostics: None.  Medication List:  Current Outpatient Medications  Medication Sig Dispense Refill   fluticasone (FLOVENT HFA) 110 MCG/ACT inhaler Inhale 2 puffs into the lungs twice daily with spacer to prevent cough or wheeze. 1 Inhaler 5   fluticasone (FLOVENT HFA) 110 MCG/ACT inhaler Inhale 2 puffs twice a day for 1-2 weeks or until cough and wheeze free 12 g 5   levocetirizine (XYZAL) 5 MG tablet TAKE 1 TABLET BY MOUTH EVERY DAY IN  THE EVENING 90 tablet 1   levocetirizine (XYZAL) 5 MG tablet Take 1 tablet (5 mg total) by mouth every evening. 30 tablet 5   norethindrone-ethinyl estradiol (JUNEL FE 1/20) 1-20 MG-MCG tablet Take 1 tablet by mouth daily. 84 tablet 0   albuterol (PROAIR HFA) 108 (90 Base) MCG/ACT inhaler Inhale 2 puffs into the lungs every 4 (four) hours as needed for wheezing or shortness of breath. 18 g 1   montelukast (SINGULAIR) 10 MG tablet Take 1 tablet (10 mg total) by mouth every evening. 30 tablet 5   Current Facility-Administered Medications  Medication Dose Route Frequency Provider Last Rate Last Admin   predniSONE (DELTASONE) tablet 10 mg  10 mg Oral Q breakfast Bobbitt, Heywood Iles, MD       Allergies: Allergies  Allergen Reactions   Chlorine Itching   I reviewed her past medical history, social history, family history, and environmental history and no significant changes have been reported from previous visit on June 08, 2020.  Review of Systems  Constitutional:  Negative for chills and fever.  HENT:         Denies rhinorrhea, nasal congestion, and postnasal drip  Eyes:        Denies itchy watery eyes  Respiratory:  Negative for cough, chest tightness and wheezing.   Cardiovascular:  Negative for chest pain.  Gastrointestinal:        Denies heartburn or reflux symptoms  Genitourinary:  Negative for dysuria.   Skin:        Denies rashes are itchy skin  Allergic/Immunologic: Positive for environmental allergies.  Neurological:  Negative for headaches.  Objective: Physical Exam Not obtained as encounter was done via telephone.   Previous notes and tests were reviewed.  I discussed the assessment and treatment plan with the patient. The patient was provided an opportunity to ask questions and all were answered. The patient agreed with the plan and demonstrated an understanding of the instructions.   The patient was advised to call back or seek an in-person evaluation if the symptoms worsen or if the condition fails to improve as anticipated.  I provided 23 minutes of non-face-to-face time during this encounter.  It was my pleasure to participate in [facility] care today. Please feel free to contact me with any questions or concerns.   Sincerely,  Nehemiah Settle, FNP

## 2021-10-16 ENCOUNTER — Other Ambulatory Visit: Payer: Self-pay | Admitting: Family

## 2021-10-19 NOTE — Telephone Encounter (Signed)
May send a 30 day supply with no refill. Needs to schedule follow up appointment

## 2022-04-08 ENCOUNTER — Other Ambulatory Visit: Payer: Self-pay

## 2022-04-08 ENCOUNTER — Other Ambulatory Visit: Payer: Self-pay | Admitting: Family

## 2022-05-09 ENCOUNTER — Other Ambulatory Visit: Payer: Self-pay | Admitting: Family
